# Patient Record
Sex: Female | Born: 1945
Health system: Southern US, Community
[De-identification: ages and names within clinical notes are randomized; demographics above are authoritative.]

## PROBLEM LIST (undated history)

## (undated) DIAGNOSIS — I4510 Unspecified right bundle-branch block: Secondary | ICD-10-CM

## (undated) DIAGNOSIS — I10 Essential (primary) hypertension: Secondary | ICD-10-CM

## (undated) DIAGNOSIS — E785 Hyperlipidemia, unspecified: Secondary | ICD-10-CM

## (undated) DIAGNOSIS — M1990 Unspecified osteoarthritis, unspecified site: Secondary | ICD-10-CM

## (undated) DIAGNOSIS — I639 Cerebral infarction, unspecified: Secondary | ICD-10-CM

## (undated) DIAGNOSIS — E669 Obesity, unspecified: Secondary | ICD-10-CM

## (undated) DIAGNOSIS — I350 Nonrheumatic aortic (valve) stenosis: Secondary | ICD-10-CM

## (undated) HISTORY — PX: BREAST BIOPSY: SHX20

## (undated) HISTORY — DX: Cerebral infarction, unspecified: I63.9

---

## 2014-01-07 DIAGNOSIS — H2511 Age-related nuclear cataract, right eye: Secondary | ICD-10-CM | POA: Insufficient documentation

## 2014-01-07 DIAGNOSIS — H18519 Endothelial corneal dystrophy, unspecified eye: Secondary | ICD-10-CM | POA: Insufficient documentation

## 2014-01-07 DIAGNOSIS — H35372 Puckering of macula, left eye: Secondary | ICD-10-CM | POA: Insufficient documentation

## 2014-01-15 DIAGNOSIS — H35352 Cystoid macular degeneration, left eye: Secondary | ICD-10-CM | POA: Insufficient documentation

## 2015-01-18 DIAGNOSIS — Z961 Presence of intraocular lens: Secondary | ICD-10-CM | POA: Insufficient documentation

## 2016-06-26 ENCOUNTER — Encounter (HOSPITAL_COMMUNITY): Payer: Self-pay | Admitting: Internal Medicine

## 2016-06-26 ENCOUNTER — Other Ambulatory Visit (HOSPITAL_COMMUNITY): Payer: Self-pay

## 2016-06-26 ENCOUNTER — Inpatient Hospital Stay (HOSPITAL_COMMUNITY)
Admission: EM | Admit: 2016-06-26 | Discharge: 2016-06-28 | DRG: 065 | Disposition: A | Discharge status: Home Health | Payer: Medicare Other | Attending: Internal Medicine | Admitting: Internal Medicine

## 2016-06-26 ENCOUNTER — Observation Stay (HOSPITAL_BASED_OUTPATIENT_CLINIC_OR_DEPARTMENT_OTHER): Payer: Medicare Other

## 2016-06-26 ENCOUNTER — Emergency Department (HOSPITAL_COMMUNITY): Payer: Medicare Other

## 2016-06-26 ENCOUNTER — Observation Stay (HOSPITAL_COMMUNITY): Payer: Medicare Other

## 2016-06-26 DIAGNOSIS — I63511 Cerebral infarction due to unspecified occlusion or stenosis of right middle cerebral artery: Secondary | ICD-10-CM | POA: Diagnosis not present

## 2016-06-26 DIAGNOSIS — E785 Hyperlipidemia, unspecified: Secondary | ICD-10-CM | POA: Diagnosis present

## 2016-06-26 DIAGNOSIS — I639 Cerebral infarction, unspecified: Secondary | ICD-10-CM | POA: Diagnosis present

## 2016-06-26 DIAGNOSIS — R29810 Facial weakness: Secondary | ICD-10-CM | POA: Diagnosis not present

## 2016-06-26 DIAGNOSIS — I6789 Other cerebrovascular disease: Secondary | ICD-10-CM

## 2016-06-26 DIAGNOSIS — I6523 Occlusion and stenosis of bilateral carotid arteries: Secondary | ICD-10-CM | POA: Diagnosis present

## 2016-06-26 DIAGNOSIS — N179 Acute kidney failure, unspecified: Secondary | ICD-10-CM | POA: Diagnosis present

## 2016-06-26 DIAGNOSIS — E669 Obesity, unspecified: Secondary | ICD-10-CM | POA: Diagnosis present

## 2016-06-26 DIAGNOSIS — I10 Essential (primary) hypertension: Secondary | ICD-10-CM | POA: Diagnosis present

## 2016-06-26 DIAGNOSIS — Z6829 Body mass index (BMI) 29.0-29.9, adult: Secondary | ICD-10-CM

## 2016-06-26 DIAGNOSIS — E876 Hypokalemia: Secondary | ICD-10-CM | POA: Diagnosis present

## 2016-06-26 DIAGNOSIS — R471 Dysarthria and anarthria: Secondary | ICD-10-CM | POA: Diagnosis present

## 2016-06-26 DIAGNOSIS — R7303 Prediabetes: Secondary | ICD-10-CM | POA: Diagnosis present

## 2016-06-26 DIAGNOSIS — I160 Hypertensive urgency: Secondary | ICD-10-CM | POA: Diagnosis present

## 2016-06-26 DIAGNOSIS — I672 Cerebral atherosclerosis: Secondary | ICD-10-CM | POA: Diagnosis present

## 2016-06-26 DIAGNOSIS — I63331 Cerebral infarction due to thrombosis of right posterior cerebral artery: Secondary | ICD-10-CM | POA: Diagnosis not present

## 2016-06-26 DIAGNOSIS — Z9114 Patient's other noncompliance with medication regimen: Secondary | ICD-10-CM

## 2016-06-26 DIAGNOSIS — R9431 Abnormal electrocardiogram [ECG] [EKG]: Secondary | ICD-10-CM | POA: Diagnosis present

## 2016-06-26 DIAGNOSIS — G459 Transient cerebral ischemic attack, unspecified: Secondary | ICD-10-CM | POA: Diagnosis not present

## 2016-06-26 DIAGNOSIS — R2990 Unspecified symptoms and signs involving the nervous system: Secondary | ICD-10-CM | POA: Diagnosis present

## 2016-06-26 DIAGNOSIS — I161 Hypertensive emergency: Secondary | ICD-10-CM | POA: Diagnosis present

## 2016-06-26 DIAGNOSIS — I4510 Unspecified right bundle-branch block: Secondary | ICD-10-CM | POA: Diagnosis present

## 2016-06-26 DIAGNOSIS — E7439 Other disorders of intestinal carbohydrate absorption: Secondary | ICD-10-CM | POA: Diagnosis present

## 2016-06-26 HISTORY — DX: Essential (primary) hypertension: I10

## 2016-06-26 HISTORY — DX: Obesity, unspecified: E66.9

## 2016-06-26 LAB — ECHOCARDIOGRAM COMPLETE
AOPV: 0.35 m/s
AV Area mean vel: 1.31 cm2
AV Peak grad: 29 mmHg
AV VEL mean LVOT/AV: 0.38
AV area mean vel ind: 0.65 cm2/m2
AV pk vel: 268 cm/s
AVA: 1.66 cm2
AVAREAVTI: 1.21 cm2
AVAREAVTIIND: 0.82 cm2/m2
AVG: 13 mmHg
CHL CUP AV PEAK INDEX: 0.6
CHL CUP AV VALUE AREA INDEX: 0.82
CHL CUP AV VEL: 1.66
CHL CUP DOP CALC LVOT VTI: 28.1 cm
DOP CAL AO MEAN VELOCITY: 162 cm/s
E decel time: 289 msec
E/e' ratio: 9.75
FS: 47 % — AB (ref 28–44)
IV/PV OW: 0.98
LA ID, A-P, ES: 31 mm
LA diam end sys: 31 mm
LA diam index: 1.54 cm/m2
LA vol A4C: 40.7 ml
LAVOL: 48.9 mL
LAVOLIN: 24.3 mL/m2
LDCA: 3.46 cm2
LV PW d: 10.2 mm — AB (ref 0.6–1.1)
LVEEAVG: 9.75
LVEEMED: 9.75
LVELAT: 6.42 cm/s
LVOTD: 21 mm
LVOTPV: 93.8 cm/s
LVOTSV: 97 mL
LVOTVTI: 0.48 cm
MV Dec: 289
MV pk A vel: 94.6 m/s
MVPKEVEL: 62.6 m/s
RV TAPSE: 19.5 mm
TDI e' lateral: 6.42
TDI e' medial: 4.46
VTI: 58.7 cm

## 2016-06-26 LAB — CBC
HEMATOCRIT: 43.2 % (ref 36.0–46.0)
Hemoglobin: 13.7 g/dL (ref 12.0–15.0)
MCH: 25.1 pg — AB (ref 26.0–34.0)
MCHC: 31.7 g/dL (ref 30.0–36.0)
MCV: 79.1 fL (ref 78.0–100.0)
PLATELETS: 187 10*3/uL (ref 150–400)
RBC: 5.46 MIL/uL — ABNORMAL HIGH (ref 3.87–5.11)
RDW: 15.9 % — AB (ref 11.5–15.5)
WBC: 5.4 10*3/uL (ref 4.0–10.5)

## 2016-06-26 LAB — LIPID PANEL
Cholesterol: 255 mg/dL — ABNORMAL HIGH (ref 0–200)
HDL: 56 mg/dL (ref >40)
LDL CALC: 189 mg/dL — AB (ref 0–99)
Total CHOL/HDL Ratio: 4.6 RATIO
Triglycerides: 49 mg/dL (ref <150)
VLDL: 10 mg/dL (ref 0–40)

## 2016-06-26 LAB — GLUCOSE, CAPILLARY: GLUCOSE-CAPILLARY: 110 mg/dL — AB (ref 65–99)

## 2016-06-26 LAB — DIFFERENTIAL
BASOS PCT: 1 %
Basophils Absolute: 0.1 10*3/uL (ref 0.0–0.1)
EOS PCT: 3 %
Eosinophils Absolute: 0.1 10*3/uL (ref 0.0–0.7)
Lymphocytes Relative: 38 %
Lymphs Abs: 2.1 10*3/uL (ref 0.7–4.0)
MONO ABS: 0.3 10*3/uL (ref 0.1–1.0)
MONOS PCT: 6 %
NEUTROS ABS: 2.8 10*3/uL (ref 1.7–7.7)
Neutrophils Relative %: 53 %

## 2016-06-26 LAB — COMPREHENSIVE METABOLIC PANEL
ALK PHOS: 68 U/L (ref 38–126)
ALT: 19 U/L (ref 14–54)
AST: 24 U/L (ref 15–41)
Albumin: 3.5 g/dL (ref 3.5–5.0)
Anion gap: 19 — ABNORMAL HIGH (ref 5–15)
BUN: 16 mg/dL (ref 6–20)
CALCIUM: 10.4 mg/dL — AB (ref 8.9–10.3)
CHLORIDE: 100 mmol/L — AB (ref 101–111)
CO2: 27 mmol/L (ref 22–32)
CREATININE: 1.08 mg/dL — AB (ref 0.44–1.00)
GFR calc Af Amer: 58 mL/min — ABNORMAL LOW (ref >60)
GFR, EST NON AFRICAN AMERICAN: 50 mL/min — AB (ref >60)
Glucose, Bld: 112 mg/dL — ABNORMAL HIGH (ref 65–99)
Potassium: 3.2 mmol/L — ABNORMAL LOW (ref 3.5–5.1)
Sodium: 146 mmol/L — ABNORMAL HIGH (ref 135–145)
Total Bilirubin: 0.7 mg/dL (ref 0.3–1.2)
Total Protein: 7.4 g/dL (ref 6.5–8.1)

## 2016-06-26 LAB — I-STAT CHEM 8, ED
BUN: 18 mg/dL (ref 6–20)
CALCIUM ION: 1.14 mmol/L (ref 1.12–1.23)
CHLORIDE: 106 mmol/L (ref 101–111)
Creatinine, Ser: 1 mg/dL (ref 0.44–1.00)
Glucose, Bld: 107 mg/dL — ABNORMAL HIGH (ref 65–99)
HCT: 43 % (ref 36.0–46.0)
HEMOGLOBIN: 14.6 g/dL (ref 12.0–15.0)
Potassium: 3.4 mmol/L — ABNORMAL LOW (ref 3.5–5.1)
SODIUM: 144 mmol/L (ref 135–145)
TCO2: 28 mmol/L (ref 0–100)

## 2016-06-26 LAB — URINALYSIS, ROUTINE W REFLEX MICROSCOPIC
BILIRUBIN URINE: NEGATIVE
Glucose, UA: NEGATIVE mg/dL
HGB URINE DIPSTICK: NEGATIVE
Ketones, ur: NEGATIVE mg/dL
Leukocytes, UA: NEGATIVE
Nitrite: NEGATIVE
PROTEIN: NEGATIVE mg/dL
Specific Gravity, Urine: 1.01 (ref 1.005–1.030)
pH: 7.5 (ref 5.0–8.0)

## 2016-06-26 LAB — RAPID URINE DRUG SCREEN, HOSP PERFORMED
AMPHETAMINES: NOT DETECTED
BARBITURATES: NOT DETECTED
BENZODIAZEPINES: NOT DETECTED
Cocaine: NOT DETECTED
Opiates: NOT DETECTED
Tetrahydrocannabinol: NOT DETECTED

## 2016-06-26 LAB — APTT: aPTT: 28 seconds (ref 24–37)

## 2016-06-26 LAB — ETHANOL

## 2016-06-26 LAB — CBG MONITORING, ED: GLUCOSE-CAPILLARY: 95 mg/dL (ref 65–99)

## 2016-06-26 LAB — I-STAT TROPONIN, ED: TROPONIN I, POC: 0.01 ng/mL (ref 0.00–0.08)

## 2016-06-26 LAB — PROTIME-INR
INR: 0.99 (ref 0.00–1.49)
PROTHROMBIN TIME: 13.3 s (ref 11.6–15.2)

## 2016-06-26 MED ORDER — HYDRALAZINE HCL 20 MG/ML IJ SOLN
10.0000 mg | INTRAMUSCULAR | Status: DC | PRN
  Administered 2016-06-27 – 2016-06-28 (×2): 10 mg via INTRAVENOUS
  Filled 2016-06-26 (×2): qty 1

## 2016-06-26 MED ORDER — ENOXAPARIN SODIUM 40 MG/0.4ML ~~LOC~~ SOLN
40.0000 mg | SUBCUTANEOUS | Status: DC
  Administered 2016-06-26 – 2016-06-27 (×2): 40 mg via SUBCUTANEOUS
  Filled 2016-06-26 (×3): qty 0.4

## 2016-06-26 MED ORDER — ACETAMINOPHEN 325 MG PO TABS: 650.0000 mg | ORAL_TABLET | ORAL | Status: DC | PRN

## 2016-06-26 MED ORDER — POTASSIUM CHLORIDE CRYS ER 20 MEQ PO TBCR
40.0000 meq | EXTENDED_RELEASE_TABLET | Freq: Once | ORAL | Status: AC
  Administered 2016-06-26: 40 meq via ORAL
  Filled 2016-06-26: qty 2

## 2016-06-26 MED ORDER — SODIUM CHLORIDE 0.9 % IV SOLN
INTRAVENOUS | Status: DC
  Administered 2016-06-26: 12:00:00 via INTRAVENOUS

## 2016-06-26 MED ORDER — LABETALOL HCL 5 MG/ML IV SOLN
20.0000 mg | Freq: Once | INTRAVENOUS | Status: AC
  Administered 2016-06-26: 20 mg via INTRAVENOUS

## 2016-06-26 MED ORDER — LISINOPRIL 5 MG PO TABS
5.0000 mg | ORAL_TABLET | Freq: Every day | ORAL | Status: DC
  Administered 2016-06-26: 5 mg via ORAL
  Filled 2016-06-26: qty 1

## 2016-06-26 MED ORDER — HYDROCHLOROTHIAZIDE 12.5 MG PO CAPS
12.5000 mg | ORAL_CAPSULE | Freq: Every day | ORAL | Status: DC
  Administered 2016-06-27 – 2016-06-28 (×2): 12.5 mg via ORAL
  Filled 2016-06-26 (×2): qty 1

## 2016-06-26 MED ORDER — ATORVASTATIN CALCIUM 80 MG PO TABS
80.0000 mg | ORAL_TABLET | Freq: Every day | ORAL | Status: DC
  Administered 2016-06-26 – 2016-06-27 (×2): 80 mg via ORAL
  Filled 2016-06-26 (×2): qty 1

## 2016-06-26 MED ORDER — CLOPIDOGREL BISULFATE 75 MG PO TABS
75.0000 mg | ORAL_TABLET | Freq: Every day | ORAL | Status: DC
  Administered 2016-06-26 – 2016-06-28 (×3): 75 mg via ORAL
  Filled 2016-06-26 (×3): qty 1

## 2016-06-26 MED ORDER — STROKE: EARLY STAGES OF RECOVERY BOOK
Freq: Once | Status: AC
  Administered 2016-06-26: 07:00:00
  Filled 2016-06-26: qty 1

## 2016-06-26 MED ORDER — LISINOPRIL 10 MG PO TABS: 10.0000 mg | ORAL_TABLET | Freq: Every day | ORAL | Status: DC

## 2016-06-26 MED ORDER — ACETAMINOPHEN 650 MG RE SUPP: 650.0000 mg | RECTAL | Status: DC | PRN

## 2016-06-26 MED ORDER — LABETALOL HCL 5 MG/ML IV SOLN
INTRAVENOUS | Status: AC
  Filled 2016-06-26: qty 4

## 2016-06-26 MED ORDER — ASPIRIN 325 MG PO TABS
325.0000 mg | ORAL_TABLET | Freq: Every day | ORAL | Status: DC
  Administered 2016-06-26 – 2016-06-28 (×3): 325 mg via ORAL
  Filled 2016-06-26 (×3): qty 1

## 2016-06-26 MED ORDER — HYDRALAZINE HCL 20 MG/ML IJ SOLN: 5.0000 mg | INTRAMUSCULAR | Status: DC | PRN

## 2016-06-26 MED ORDER — SENNOSIDES-DOCUSATE SODIUM 8.6-50 MG PO TABS
1.0000 | ORAL_TABLET | Freq: Every evening | ORAL | Status: DC | PRN
  Filled 2016-06-26: qty 1

## 2016-06-26 MED ORDER — LISINOPRIL 5 MG PO TABS
5.0000 mg | ORAL_TABLET | Freq: Two times a day (BID) | ORAL | Status: DC
  Administered 2016-06-26 – 2016-06-28 (×4): 5 mg via ORAL
  Filled 2016-06-26 (×4): qty 1

## 2016-06-26 NOTE — Progress Notes (Signed)
  Echocardiogram 2D Echocardiogram has been performed.  Tye SavoyCasey N Jovontae Banko 06/26/2016, 2:55 PM

## 2016-06-26 NOTE — ED Provider Notes (Signed)
CSN: [id number]     Arrival date & time 06/26/16  0508 History   First MD Initiated Contact with Patient 06/26/16 0518     Chief Complaint  Patient presents with  . Numbness    An emergency department physician performed an initial assessment on this suspected stroke patient at [phone]. (Consider location/radiation/quality/duration/timing/severity/associated sxs/prior Treatment) HPI  [name] is a 70 year old female with a past medical history of hypertension presenting today with strokelike symptoms. Patient states she has left-sided numbness. She denies history of stroke. Per EMS, patient initially had slurred speech and facial droop which resolved on its own upon ED arrival. Patient states this happened to her yesterday as well and resolved on its own. Today was much more permanent. Nothing makes the symptoms better or worse. There are no further complaints.  10 Systems reviewed and are negative for acute change except as noted in the HPI.     No past medical history on file. No past surgical history on file. No family history on file. Social History  Substance Use Topics  . Smoking status: Not on file  . Smokeless tobacco: Not on file  . Alcohol Use: Not on file   OB History    No data available     Review of Systems    Allergies  Review of patient's allergies indicates not on file.  Home Medications   Prior to Admission medications   Not on File   BP 232/109 mmHg  Pulse 70  Temp(Src) 98.1 F (36.7 C) (Oral)  Resp 21  SpO2 100% Physical Exam  Constitutional: She is oriented to person, place, and time. She appears well-developed and well-nourished. No distress.  HENT:  Head: Normocephalic and atraumatic.  Nose: Nose normal.  Mouth/Throat: Oropharynx is clear and moist. No oropharyngeal exudate.  Eyes: Conjunctivae and EOM are normal. Pupils are equal, round, and reactive to light. No scleral icterus.  Neck: Normal range of motion. Neck supple. No JVD present.  No tracheal deviation present. No thyromegaly present.  Cardiovascular: Normal rate, regular rhythm and normal heart sounds.  Exam reveals no gallop and no friction rub.   No murmur heard. Pulmonary/Chest: Effort normal and breath sounds normal. No respiratory distress. She has no wheezes. She exhibits no tenderness.  Abdominal: Soft. Bowel sounds are normal. She exhibits no distension and no mass. There is no tenderness. There is no rebound and no guarding.  Musculoskeletal: Normal range of motion. She exhibits no edema or tenderness.   L sided dec sensation  Lymphadenopathy:    She has no cervical adenopathy.  Neurological: She is alert and oriented to person, place, and time. No cranial nerve deficit. She exhibits normal muscle tone.  Skin: Skin is warm and dry. No rash noted. No erythema. No pallor.  Nursing note and vitals reviewed.   ED Course  Procedures (including critical care time) Labs Review Labs Reviewed  CBC - Abnormal; Notable for the following:    RBC 5.46 (*)    MCH 25.1 (*)    RDW 15.9 (*)    All other components within normal limits  COMPREHENSIVE METABOLIC PANEL - Abnormal; Notable for the following:    Sodium 146 (*)    Potassium 3.2 (*)    Chloride 100 (*)    Glucose, Bld 112 (*)    Creatinine, Ser 1.08 (*)    Calcium 10.4 (*)    GFR calc non Af Amer 50 (*)    GFR calc Af Amer 58 (*)  Anion gap 19 (*)    All other components within normal limits  I-STAT CHEM 8, ED - Abnormal; Notable for the following:    Potassium 3.4 (*)    Glucose, Bld 107 (*)    All other components within normal limits  ETHANOL  PROTIME-INR  APTT  DIFFERENTIAL  URINE RAPID DRUG SCREEN, HOSP PERFORMED  URINALYSIS, ROUTINE W REFLEX MICROSCOPIC (NOT AT [facility])  I-STAT TROPOININ, ED  CBG MONITORING, ED    Imaging Review Ct Head Code Stroke W/o Cm  06/26/2016  CLINICAL DATA:  Code stroke. Acute onset of left-sided numbness. Initial encounter. EXAM: CT HEAD WITHOUT CONTRAST  TECHNIQUE: Contiguous axial images were obtained from the base of the skull through the vertex without intravenous contrast. COMPARISON:  None. FINDINGS: There is no evidence of acute infarction, mass lesion, or intra- or extra-axial hemorrhage on CT. Mild periventricular white matter change may reflect small vessel ischemic microangiopathy. The posterior fossa, including the cerebellum, brainstem and fourth ventricle, is within normal limits. The third and lateral ventricles, and basal ganglia are unremarkable in appearance. The cerebral hemispheres are symmetric in appearance, with normal gray-white differentiation. No mass effect or midline shift is seen. There is no evidence of fracture; visualized osseous structures are unremarkable in appearance. The visualized portions of the orbits are within normal limits. The paranasal sinuses and mastoid air cells are well-aerated. No significant soft tissue abnormalities are seen. IMPRESSION: 1. No acute intracranial pathology seen on CT. 2. Suggestion of mild small vessel ischemic microangiopathy. These results were called by telephone at the time of interpretation on 06/26/2016 at 5:27 am to Dr. Roseanne RenoStewart, who verbally acknowledged these results. Electronically Signed   By: Roanna RaiderJeffery  Chang M.D.   On: 06/26/2016 05:27   I have personally reviewed and evaluated these images and lab results as part of my medical decision-making.   EKG Interpretation   Date/Time:  Monday June 26 2016 05:31:41 EDT Ventricular Rate:  69 PR Interval:    QRS Duration: 156 QT Interval:  422 QTC Calculation: 453 R Axis:   10 Text Interpretation:  Sinus rhythm Right bundle branch block No old  tracing to compare Confirmed by Erroll Lunani, Ariadna Setter Ayokunle [id number]) on 06/26/2016  5:38:41 AM      MDM   Final diagnoses:  None   Patient presents to the ED for numbness on the L side.  She states she is still having the symptoms but they have improved.  Her BP is very high and perhaps the cause  of her stroke.  CT head neg for bleed.  Dr. Roseanne RenoStewart evaluated the patient and recs for medical admission for further work up.  He ordered labetalol for the HTN.  Will page hospitalist for further care.   6:29 AM Is poke with Dr. Toniann FailKakrakandy who accrpts the patient for further care.   Tomasita CrumbleAdeleke Tushar Enns, MD 06/26/16 [id number]

## 2016-06-26 NOTE — Care Management Note (Signed)
Case Management Note  Patient Details  Name: Allison Schneider MRN: [id number] Date of Birth: 11/18/1946  Subjective/Objective:                  70 y.o. female with medical history significant obesity, hypertension presents to the emergency department from home with the chief complaint of left sided numbness/tingling and left leg weakness. Initial evaluation concerning for CVA.// From home; resides in [place].  Action/Plan: Follow for disposition needs.   Expected Discharge Date:                  Expected Discharge Plan:  Home w Home Health Services  In-House Referral:     Discharge planning Services  CM Consult  Post Acute Care Choice:    Choice offered to:     DME Arranged:    DME Agency:     HH Arranged:    HH Agency:     Status of Service:  In process, will continue to follow  If discussed at Long Length of Stay Meetings, dates discussed:    Additional Comments:  Oletta CohnWood, Demetrius Mahler, RN 06/26/2016, 10:11 AM

## 2016-06-26 NOTE — ED Notes (Signed)
Attempted to call report x 1  

## 2016-06-26 NOTE — H&P (Signed)
History and Physical    Allison Schneider RUE:[id number] DOB: 09/09/1946 DOA: 06/26/2016  PCP: No primary care provider on file. PCP in [place] Patient coming from: home  Chief Complaint: left sided numbness/tingling/left leg weakness  HPI: Allison Schneider is a very pleasant 70 y.o. female with medical history significant obesity, hypertension presents to the emergency department from home with the chief complaint of left sided numbness/tingling and left leg weakness. Initial evaluation concerning for CVA.  Information is obtained from the patient. She reports a 20 minute episode of left arm numbness tingling left leg numbness tingling and weakness and left facial "tightness" 2 days ago. This morning at 3 AM she was awakened to go to the restroom and noted the same symptoms "only worse". In addition she noted her gait was unsteady as she was walking to the bathroom. She denied headache dizziness visual disturbances but described her gait as "wobbly". She went to the kitchen and drank some water she denied any difficulty swallowing. She noted the symptoms were "worse and not going away" and she decided call 911. She reports EMS commented that she had a left facial droop and that her speech was slurred. She denies chest pain palpitation abdominal pain nausea vomiting. She denies any fever chills dysuria hematuria frequency or urgency. She denies constipation diarrhea melena or bright red blood per rectum. She has recently traveled to the area from the [place] where she resides. She does not smoke and has never smoked. She denies any illicit drug use. No EtOH    ED Course: He received IV fluids and labetalol intravenously for hypertension  Review of Systems: As per HPI otherwise 10 point review of systems negative.   Ambulatory Status: Prior to this she ambulates independently with a steady gait  Past Medical History  Diagnosis Date  . Obesity   . Hypertension     No past surgical history  on file.  Social History   Social History  . Marital Status: N/A    Spouse Name: N/A  . Number of Children: N/A  . Years of Education: N/A   Occupational History  . Not on file.   Social History Main Topics  . Smoking status: Not on file  . Smokeless tobacco: Not on file  . Alcohol Use: Not on file  . Drug Use: Not on file  . Sexual Activity: Not on file   Other Topics Concern  . Not on file   Social History Narrative  . No narrative on file    No Known Allergies  Family History  Problem Relation Age of Onset  . CAD Mother     MI   patient is adopted and notes her biological mother with CAD. Other family medical hx unknown  Prior to Admission medications   Not on File    Physical Exam: Filed Vitals:   06/26/16 0630 06/26/16 0645 06/26/16 0706 06/26/16 0715  BP: 203/95 200/110 202/98 201/97  Pulse: 64 64 66 64  Temp:      TempSrc:      Resp: 12 18 21 18   SpO2: 100% 100% 99% 100%     General:  Appears calm and comfortable Eyes:  PERRL, EOMI, normal lids, iris ENT:  grossly normal hearing, lips & tongue, Mucous membranes of her mouth are pink slightly dry Neck:  no LAD, masses or thyromegaly Cardiovascular:  RRR, no m/r/g. No LE edema. Pulses present and palpable Respiratory:  CTA bilaterally, no w/r/r. Normal respiratory effort. Abdomen:  soft, ntnd,  positive bowel sounds throughout no guarding or rebounding Skin:  no rash or induration seen on limited exam Musculoskeletal:  grossly normal tone BUE/BLE, good ROM, no bony abnormality Psychiatric:  grossly normal mood and affect, speech fluent and appropriate, AOx3 Neurologic:  CN 2-12 grossly intact, moves all extremities in coordinated fashion, sensation intact. Bilateral grip 5 out of 5 lower extremity strength 5 out of 5 reports decreased sensation on left lateral leg tongue midline no pronator drift speech clear facial symmetry  Labs on Admission: I have personally reviewed following labs and imaging  studies  CBC:  Recent Labs Lab 06/26/16 0515 06/26/16 0518  WBC 5.4  --   NEUTROABS 2.8  --   HGB 13.7 14.6  HCT 43.2 43.0  MCV 79.1  --   PLT 187  --    Basic Metabolic Panel:  Recent Labs Lab 06/26/16 0515 06/26/16 0518  NA 146* 144  K 3.2* 3.4*  CL 100* 106  CO2 27  --   GLUCOSE 112* 107*  BUN 16 18  CREATININE 1.08* 1.00  CALCIUM 10.4*  --    GFR: CrCl cannot be calculated (Unknown ideal weight.). Liver Function Tests:  Recent Labs Lab 06/26/16 0515  AST 24  ALT 19  ALKPHOS 68  BILITOT 0.7  PROT 7.4  ALBUMIN 3.5   No results for input(s): LIPASE, AMYLASE in the last 168 hours. No results for input(s): AMMONIA in the last 168 hours. Coagulation Profile:  Recent Labs Lab 06/26/16 0515  INR 0.99   Cardiac Enzymes: No results for input(s): CKTOTAL, CKMB, CKMBINDEX, TROPONINI in the last 168 hours. BNP (last 3 results) No results for input(s): PROBNP in the last 8760 hours. HbA1C: No results for input(s): HGBA1C in the last 72 hours. CBG:  Recent Labs Lab 06/26/16 0535  GLUCAP 95   Lipid Profile: No results for input(s): CHOL, HDL, LDLCALC, TRIG, CHOLHDL, LDLDIRECT in the last 72 hours. Thyroid Function Tests: No results for input(s): TSH, T4TOTAL, FREET4, T3FREE, THYROIDAB in the last 72 hours. Anemia Panel: No results for input(s): VITAMINB12, FOLATE, FERRITIN, TIBC, IRON, RETICCTPCT in the last 72 hours. Urine analysis:    Component Value Date/Time   COLORURINE YELLOW 06/26/2016 0700   APPEARANCEUR CLEAR 06/26/2016 0700   LABSPEC 1.010 06/26/2016 0700   PHURINE 7.5 06/26/2016 0700   GLUCOSEU NEGATIVE 06/26/2016 0700   HGBUR NEGATIVE 06/26/2016 0700   BILIRUBINUR NEGATIVE 06/26/2016 0700   KETONESUR NEGATIVE 06/26/2016 0700   PROTEINUR NEGATIVE 06/26/2016 0700   NITRITE NEGATIVE 06/26/2016 0700   LEUKOCYTESUR NEGATIVE 06/26/2016 0700    Creatinine Clearance: CrCl cannot be calculated (Unknown ideal weight.).  Sepsis  Labs: (procalcitonin:4,lacticidven:4) )No results found for this or any previous visit (from the past 240 hour(s)).   Radiological Exams on Admission: Dg Chest 2 View  06/26/2016  CLINICAL DATA:  Sudden onset of left-sided weakness and numbness earlier today common no chest complaints EXAM: CHEST  2 VIEW COMPARISON:  None in PACs FINDINGS: The lungs are well-expanded and clear. The heart and pulmonary vascularity are normal. There is calcification in the wall of the aortic arch. There is no pleural effusion or pneumothorax. The bony thorax is unremarkable. IMPRESSION: 1. No active cardiopulmonary disease. 2. Aortic atherosclerosis. Electronically Signed   By: David  [place] M.D.   On: 06/26/2016 07:39   Ct Head Code Stroke W/o Cm  06/26/2016  CLINICAL DATA:  Code stroke. Acute onset of left-sided numbness. Initial encounter. EXAM: CT HEAD WITHOUT CONTRAST TECHNIQUE: Contiguous axial images  were obtained from the base of the skull through the vertex without intravenous contrast. COMPARISON:  None. FINDINGS: There is no evidence of acute infarction, mass lesion, or intra- or extra-axial hemorrhage on CT. Mild periventricular white matter change may reflect small vessel ischemic microangiopathy. The posterior fossa, including the cerebellum, brainstem and fourth ventricle, is within normal limits. The third and lateral ventricles, and basal ganglia are unremarkable in appearance. The cerebral hemispheres are symmetric in appearance, with normal gray-white differentiation. No mass effect or midline shift is seen. There is no evidence of fracture; visualized osseous structures are unremarkable in appearance. The visualized portions of the orbits are within normal limits. The paranasal sinuses and mastoid air cells are well-aerated. No significant soft tissue abnormalities are seen. IMPRESSION: 1. No acute intracranial pathology seen on CT. 2. Suggestion of mild small vessel ischemic microangiopathy. These  results were called by telephone at the time of interpretation on 06/26/2016 at 5:27 am to Dr. Roseanne RenoStewart, who verbally acknowledged these results. Electronically Signed   By: Roanna RaiderJeffery  Chang M.D.   On: 06/26/2016 05:27    EKG: Independently reviewed. Sinus rhythm Right bundle branch block   Assessment/Plan Principal Problem:   Stroke-like symptoms Active Problems:   Stroke (HCC)   Hypokalemia   Hypertension   Acute kidney injury (HCC)   Obesity   Hypercalcemia   Abnormal EKG   #1. Strokelike symptoms. Patient with 2 episodes left facial tightness, left arm numbness tingling, left leg numbness tingling and weakness. Symptoms mostly resolved on admission. Risk factors include obesity and possible family medical history. Neurology consulted while in the ED who opined patient not a candidate for TPA due to minimal neurological changes. -Admit to telemetry -Obtain chest x-ray 2-D echo carotid Doppler -Obtain MRI/MRA of the brain -Obtain lipid panel hemoglobin A1c -Aspirin -Statin -Control blood pressure -Physical therapy, occupational therapy -Healthy diet once he passes a bedside swallow eval  #2. Hypertensive urgency. Blood pressure 232/109 on admission. She received 20 mg of labetalol IV in the emergency department. Blood pressure 201/97 on admission. Patient reports being diagnosed with hypertension in the past but not on any medications. -When necessary hydralazine -Monitor parameters closely -Consider adding low-dose ACE inhibitor if blood pressure remains elevated  #3. Hypokalemia. Mild. Potassium 3.4 on admission. -Replete -Recheck in the morning  #4. Acute kidney injury. Mild. Creatinine 1.08 on admission. Likely related to decreased oral intake -Monitor urine output -Hold nephrotoxins -Gentle IV fluids -Recheck in the morning  #5. Obesity. -obtain weight -consider nutritional consult  #6. Hypercalcemia. mild Calcium 10.4.  -gentle fluids -recheck in am  #7. Abnormal  EKG. EKG with RBBB. No chest pain. Initial troponin negative -monitor on tele -echo as noted above       DVT prophylaxis: scd  Code Status: full  Family Communication: none present. [place]  Disposition Plan: home  Consults called: neuro consulted by ED MD  Admission status: obs   Gwenyth BenderBLACK,KAREN M MD [facility]  If 7PM-7AM, please contact night-coverage [email or web address] Password [email or web address]  06/26/2016, 8:43 AM

## 2016-06-26 NOTE — Code Documentation (Signed)
Code stroke called at 0502 for this 70 y/o black female pt who was LSW at 0330 when she awoke to go to the bathroom.  On return from the bathroom she states  Her left arm, hand and leg felt numb and she had difficulty returning to her bed.  She called EMS and upon their arrival they noted her to have slurred speech and a left facial  droop with a BP 209/113.  Pt admits to not taking her BP meds for the past year.CBG 116  [facility]508 pt's slurred speech and facial droop had resolved.  She was cleared for CT by Dr Mora Bellmanni at [phone] arriving in CT at [phone].  The result of her head CT of no acute intracranial pathology was called to Dr Roseanne RenoStewart at [phone].  Pt was returned to ED room A4 where she scored a 2 on the NIHSS with 1 point  given for decreased sensation on left side and 1 point for left LE ataxia. Her BP on return from CT was 232/109 for which she was given Labetolol 20 mg IV per Dr Roseanne RenoStewart.  Pt remains within the TPA window until 0800 hrs.

## 2016-06-26 NOTE — Evaluation (Signed)
Physical Therapy Evaluation and Discharge Patient Details Name: Allison Schneider MRN: [id number] DOB: 12/08/1946 Today's Date: 06/26/2016   History of Present Illness  70 y.o. female with a history of hypertension currently not on medication, presenting with new onset left-sided numbness and slurred speech. Transient left facial droop was noted by EMS. Patient has no previous history of stroke nor TIA. She has not been on antiplatelet therapy. CT scan of her head showed no acute intracranial abnormality. She did not experience weakness involving left extremities. Speech has returned to normal. NIH stroke score was 2. Blood pressure was elevated at 232/109. She was given labetalol 20 mg IV. She was LKW at 3:30 AM on 06/26/2016. Patient was not administered IV t-PA secondary to minimal deficits. MRI + subtle acute to subacute lacunar infarct in the  Clinical Impression  Pt admitted with above. Pt functioning at baseline and all symptoms have resolved. Pt at minimal falls risk as indicated by score of 23/24 on DGI. Pt with good home set up and support. Pt with no further acute PT needs at this time. PT SIGNING OFF. Please re-consult if needed in future.    Follow Up Recommendations No PT follow up    Equipment Recommendations  None recommended by PT    Recommendations for Other Services       Precautions / Restrictions Precautions Precautions: None Restrictions Weight Bearing Restrictions: No      Mobility  Bed Mobility Overal bed mobility: Independent                Transfers Overall transfer level: Modified independent Equipment used: None             General transfer comment: no episodes of instability  Ambulation/Gait Ambulation/Gait assistance: Independent Ambulation Distance (Feet): 300 Feet Assistive device: None Gait Pattern/deviations: Step-to pattern Gait velocity: wfl Gait velocity interpretation: at or above normal speed for age/gender General Gait Details: no  episodes of LOB  Stairs Stairs: Yes Stairs assistance: Modified independent (Device/Increase time) Stair Management: One rail Left;Alternating pattern Number of Stairs: 10 General stair comments: safe  Wheelchair Mobility    Modified Rankin (Stroke Patients Only) Modified Rankin (Stroke Patients Only) Pre-Morbid Rankin Score: No symptoms Modified Rankin: No significant disability     Balance                                 Standardized Balance Assessment Standardized Balance Assessment : Dynamic Gait Index   Dynamic Gait Index Level Surface: Normal Change in Gait Speed: Normal Gait with Horizontal Head Turns: Normal Gait with Vertical Head Turns: Mild Impairment Gait and Pivot Turn: Normal Step Over Obstacle: Normal Step Around Obstacles: Normal Steps: Normal Total Score: 23       Pertinent Vitals/Pain Pain Assessment: No/denies pain    Home Living Family/patient expects to be discharged to:: Private residence Living Arrangements: Other (Comment) Available Help at Discharge: Available PRN/intermittently Type of Home: House Home Access: Ramped entrance     Home Layout: One level   Additional Comments: currently visiting sister who is an amputee adn home is accessible    Prior Function Level of Independence: Independent               Hand Dominance   Dominant Hand: Right    Extremity/Trunk Assessment   Upper Extremity Assessment: Overall WFL for tasks assessed           Lower Extremity Assessment: [facility]  for tasks assessed      Cervical / Trunk Assessment: Normal  Communication   Communication: No difficulties  Cognition Arousal/Alertness: Awake/alert Behavior During Therapy: WFL for tasks assessed/performed Overall Cognitive Status: Within Functional Limits for tasks assessed                      General Comments      Exercises        Assessment/Plan    PT Assessment Patent does not need any  further PT services  PT Diagnosis Generalized weakness   PT Problem List    PT Treatment Interventions     PT Goals (Current goals can be found in the Care Plan section) Acute Rehab PT Goals Patient Stated Goal: to go home PT Goal Formulation: All assessment and education complete, DC therapy    Frequency     Barriers to discharge        Co-evaluation               End of Session Equipment Utilized During Treatment: Gait belt Activity Tolerance: Patient tolerated treatment well Patient left: in bed;with call bell/phone within reach Nurse Communication: Mobility status    Functional Assessment Tool Used: clinical judgement Functional Limitation: Mobility: Walking and moving around Mobility: Walking and Moving Around Current Status (W1191(G8978): 0 percent impaired, limited or restricted Mobility: Walking and Moving Around Goal Status [phone](G8979): 0 percent impaired, limited or restricted Mobility: Walking and Moving Around Discharge Status [phone](G8980): 0 percent impaired, limited or restricted    Time: [id number] PT Time Calculation (min) (ACUTE ONLY): 30 min   Charges:   PT Evaluation $PT Eval Low Complexity: 1 Procedure PT Treatments $Gait Training: 8-22 mins   PT G Codes:   PT G-Codes **NOT FOR INPATIENT CLASS** Functional Assessment Tool Used: clinical judgement Functional Limitation: Mobility: Walking and moving around Mobility: Walking and Moving Around Current Status (N6295(G8978): 0 percent impaired, limited or restricted Mobility: Walking and Moving Around Goal Status (M8413(G8979): 0 percent impaired, limited or restricted Mobility: Walking and Moving Around Discharge Status [phone](G8980): 0 percent impaired, limited or restricted    Marcene BrawnChadwell, Allison Schneider 06/26/2016, 4:10 PM   Lewis ShockAshly Arnett Schneider, PT, DPT Pager #: [phone] Office #: [phone]

## 2016-06-26 NOTE — ED Notes (Signed)
Patient transported to MRI 

## 2016-06-26 NOTE — Progress Notes (Signed)
Occupational Therapy Evaluation Patient Details Name: Allison Schneider MRN: [id number] DOB: 01/23/1946 Today's Date: 06/26/2016    History of Present Illness 70 y.o. female with a history of hypertension currently not on medication, presenting with new onset left-sided numbness and slurred speech. Transient left facial droop was noted by EMS. Patient has no previous history of stroke nor TIA. She has not been on antiplatelet therapy. CT scan of her head showed no acute intracranial abnormality. She did not experience weakness involving left extremities. Speech has returned to normal. NIH stroke score was 2. Blood pressure was elevated at 232/109. She was given labetalol 20 mg IV. She was LKW at 3:30 AM on 06/26/2016. Patient was not administered IV t-PA secondary to minimal deficits. MRI + subtle acute to subacute lacunar infarct in the   Clinical Impression   Pt appears back to her baseline regarding ADL and functional mobility for ADL. Pt states her numbness has resolved. [place] sister after going to her high school reunion. [place]. Educated pt on signs and symptoms of CVA using "FAST". Pt safe to D/C home when medically stable. OT signing off.     Follow Up Recommendations  No OT follow up;Supervision - Intermittent    Equipment Recommendations  None recommended by OT    Recommendations for Other Services       Precautions / Restrictions Precautions Precautions: None      Mobility Bed Mobility Overal bed mobility: Independent                Transfers Overall transfer level: Modified independent                    Balance Overall balance assessment: No apparent balance deficits (not formally assessed)                                          ADL Overall ADL's : At baseline                                             Vision Vision Assessment?: Yes Eye  Alignment: Within Functional Limits Ocular Range of Motion: Within Functional Limits Alignment/Gaze Preference: Within Defined Limits Tracking/Visual Pursuits: Able to track stimulus in all quads without difficulty Saccades: Within functional limits Convergence: Within functional limits Visual Fields: No apparent deficits   Perception     Praxis Praxis Praxis tested?: Within functional limits    Pertinent Vitals/Pain Pain Assessment: No/denies pain     Hand Dominance Right   Extremity/Trunk Assessment Upper Extremity Assessment Upper Extremity Assessment: Overall WFL for tasks assessed   Lower Extremity Assessment Lower Extremity Assessment: Overall WFL for tasks assessed   Cervical / Trunk Assessment Cervical / Trunk Assessment: Normal   Communication Communication Communication: No difficulties   Cognition Arousal/Alertness: Awake/alert Behavior During Therapy: WFL for tasks assessed/performed Overall Cognitive Status: Within Functional Limits for tasks assessed                     General Comments       Exercises       Shoulder Instructions      Home Living Family/patient expects to be discharged to:: Private residence  Living Arrangements: Other (Comment) Available Help at Discharge: Available PRN/intermittently Type of Home: House Home Access: Ramped entrance     Home Layout: One level     Bathroom Shower/Tub: Producer, television/film/videoWalk-in shower   Bathroom Toilet: Standard Bathroom Accessibility: Yes       Additional Comments: currently visiting sister who is an amputee adn home is accessible      Prior Functioning/Environment Level of Independence: Independent             OT Diagnosis: Other (comment) (altered sensation)   Luisa DagoHilary Eleuterio Dollar, OTR/L  [phone] 7/3/2017OT Problem List: Impaired sensation   OT Treatment/Interventions:      OT Goals(Current goals can be found in the care plan section) Acute Rehab OT Goals Patient Stated Goal: to go  home OT Goal Formulation: All assessment and education complete, DC therapy  OT Frequency:     Barriers to D/C:            Co-evaluation              End of Session Equipment Utilized During Treatment: Gait belt Nurse Communication: Mobility status;Other (comment) (completed first check off on mobility sheet)  Activity Tolerance: Patient tolerated treatment well Patient left: in chair;with call bell/phone within reach;with nursing/sitter in room   Time: 1150-1205 OT Time Calculation (min): 15 min Charges:  OT General Charges $OT Visit: 1 Procedure OT Evaluation $OT Eval Low Complexity: 1 Procedure G-Codes: OT G-codes **NOT FOR INPATIENT CLASS** Functional Assessment Tool Used: clinical judgemetn Functional Limitation: Self care Self Care Current Status (A5409(G8987): 0 percent impaired, limited or restricted Self Care Goal Status (W1191(G8988): 0 percent impaired, limited or restricted Self Care Discharge Status (Y7829(G8989): 0 percent impaired, limited or restricted  Davontae Prusinski,HILLARY 06/26/2016, 12:12 PM

## 2016-06-26 NOTE — Consult Note (Signed)
Admission H&P    Chief Complaint: New onset left side numbness and slurred speech.  HPI: Allison Schneider is an 70 y.o. female with a history of hypertension currently not on medication, presenting with new onset left-sided numbness and slurred speech. Transient left facial droop was noted by EMS. Patient has no previous history of stroke nor TIA. She has not been on antiplatelet therapy. CT scan of her head showed no acute intracranial abnormality. She did not experience weakness involving left extremities. Speech has returned to normal. NIH stroke score was 2. Blood pressure was elevated at 232/109. She was given labetalol 20 mg IV.  LSN: 3:30 AM on 06/26/2016 tPA Given: No: Minimal deficits mRankin:  Past medical history: Hypertension  No past surgical history on file.  Family history: Reviewed and negative for stroke.  Social History:  has no tobacco, alcohol, and drug history on file.  Allergies: Allergies not on file  Medications: None prior to admission  ROS: History obtained from the patient  General ROS: negative for - chills, fatigue, fever, night sweats, weight gain or weight loss Psychological ROS: negative for - behavioral disorder, hallucinations, memory difficulties, mood swings or suicidal ideation Ophthalmic ROS: negative for - blurry vision, double vision, eye pain or loss of vision ENT ROS: negative for - epistaxis, nasal discharge, oral lesions, sore throat, tinnitus or vertigo Allergy and Immunology ROS: negative for - hives or itchy/watery eyes Hematological and Lymphatic ROS: negative for - bleeding problems, bruising or swollen lymph nodes Endocrine ROS: negative for - galactorrhea, hair pattern changes, polydipsia/polyuria or temperature intolerance Respiratory ROS: negative for - cough, hemoptysis, shortness of breath or wheezing Cardiovascular ROS: negative for - chest pain, dyspnea on exertion, edema or irregular heartbeat Gastrointestinal ROS: negative for  - abdominal pain, diarrhea, hematemesis, nausea/vomiting or stool incontinence Genito-Urinary ROS: negative for - dysuria, hematuria, incontinence or urinary frequency/urgency Musculoskeletal ROS: negative for - joint swelling or muscular weakness Neurological ROS: as noted in HPI Dermatological ROS: negative for rash and skin lesion changes  Physical Examination: Blood pressure 232/109, pulse 71, temperature 98.1 F (36.7 C), temperature source Oral, resp. rate 16, SpO2 99 %.  HEENT-  Normocephalic, no lesions, without obvious abnormality.  Normal external eye and conjunctiva.  Normal TM's bilaterally.  Normal auditory canals and external ears. Normal external nose, mucus membranes and septum.  Normal pharynx. Neck supple with no masses, nodes, nodules or enlargement. Cardiovascular - regular rate and rhythm and systolic murmur: systolic ejection 3/6, crescendo at 2nd left intercostal space Lungs - chest clear, no wheezing, rales, normal symmetric air entry, Heart exam - S1, S2 normal, no murmur, no gallop, rate regular Abdomen - soft, non-tender; bowel sounds normal; no masses,  no organomegaly Extremities - no joint deformities, effusion, or inflammation and no edema  Neurologic Examination: Mental Status: Alert, oriented, thought content appropriate.  Speech fluent without evidence of aphasia. Able to follow commands without difficulty. Cranial Nerves: II-Visual fields were normal. III/IV/VI-Pupils were equal and reacted normally to light. Extraocular movements were full and conjugate.    V/VII-no facial numbness and no facial weakness. VIII-normal. X-normal speech. XI: trapezius strength/neck flexion strength normal bilaterally XII-midline tongue extension with normal strength. Motor: 5/5 bilaterally with normal tone and bulk Sensory: Reduced perception of tactile sensation over left extremities compared to right extremities. Deep Tendon Reflexes: 1+ and symmetric. Plantars:  Flexor bilaterally Cerebellar: Normal finger-to-nose testing. Carotid auscultation: Transmitted systolic murmur bilaterally  Results for orders placed or performed during the hospital encounter of 06/26/16 (  from the past 48 hour(s))  I-Stat Chem 8, ED  (not at [facility], [facility])     Status: Abnormal   Collection Time: 06/26/16  5:18 AM  Result Value Ref Range   Sodium 144 135 - 145 mmol/L   Potassium 3.4 (L) 3.5 - 5.1 mmol/L   Chloride 106 101 - 111 mmol/L   BUN 18 6 - 20 mg/dL   Creatinine, Ser 1.611.00 0.44 - 1.00 mg/dL   Glucose, Bld 096107 (H) 65 - 99 mg/dL   Calcium, Ion 0.451.14 4.091.12 - 1.23 mmol/L   TCO2 28 0 - 100 mmol/L   Hemoglobin 14.6 12.0 - 15.0 g/dL   HCT 81.143.0 91.436.0 - 78.246.0 %  I-stat troponin, ED ([facility])     Status: None   Collection Time: 06/26/16  5:26 AM  Result Value Ref Range   Troponin i, poc 0.01 0.00 - 0.08 ng/mL   Comment 3            Comment: Due to the release kinetics of cTnI, a negative result within the first hours of the onset of symptoms does not rule out myocardial infarction with certainty. If myocardial infarction is still suspected, repeat the test at appropriate intervals.    Ct Head Code Stroke W/o Cm  06/26/2016  CLINICAL DATA:  Code stroke. Acute onset of left-sided numbness. Initial encounter. EXAM: CT HEAD WITHOUT CONTRAST TECHNIQUE: Contiguous axial images were obtained from the base of the skull through the vertex without intravenous contrast. COMPARISON:  None. FINDINGS: There is no evidence of acute infarction, mass lesion, or intra- or extra-axial hemorrhage on CT. Mild periventricular white matter change may reflect small vessel ischemic microangiopathy. The posterior fossa, including the cerebellum, brainstem and fourth ventricle, is within normal limits. The third and lateral ventricles, and basal ganglia are unremarkable in appearance. The cerebral hemispheres are symmetric in appearance, with normal gray-white differentiation. No mass effect or  midline shift is seen. There is no evidence of fracture; visualized osseous structures are unremarkable in appearance. The visualized portions of the orbits are within normal limits. The paranasal sinuses and mastoid air cells are well-aerated. No significant soft tissue abnormalities are seen. IMPRESSION: 1. No acute intracranial pathology seen on CT. 2. Suggestion of mild small vessel ischemic microangiopathy. These results were called by telephone at the time of interpretation on 06/26/2016 at 5:27 am to Dr. Roseanne RenoStewart, who verbally acknowledged these results. Electronically Signed   By: Roanna RaiderJeffery  Chang M.D.   On: 06/26/2016 05:27    Assessment: 70 y.o. female presenting with probable transient ischemic attack or small cortical right MCA territory stroke as well as tensive urgency.  Stroke Risk Factors - hypertension  Plan: 1. HgbA1c, fasting lipid panel 2. MRI, MRA  of the brain without contrast 3. PT consult, OT consult 4. Echocardiogram 5. Carotid dopplers 6. Prophylactic therapy-Antiplatelet med: Aspirin  7. Risk factor modification 8. Telemetry monitoring  C.R. Roseanne RenoStewart, MD [facility] [phone]  06/26/2016, 5:37 AM

## 2016-06-26 NOTE — Progress Notes (Signed)
Received from ED via stretcher; patient is alert and oriented; oriented to room and unit routine. Nephew present at bedside.

## 2016-06-26 NOTE — Plan of Care (Signed)
70 year old female with history of hypertension presents to the ER after patient had sudden onset of slurred speech and left sided numbness at around 4 AM. Patient's blood pressure is markedly elevated in the ER with more than 230 systolic. CT head was unremarkable neurologist was consulted and felt patient was not a candidate for TPA due to minimal neurological changes. Patient has been admitted for further stroke workup. Patient was given 1 dose of labetalol by the neurologist for blood pressure.  Midge MiniumArshad Kakrakandy

## 2016-06-26 NOTE — ED Notes (Signed)
Patient arrived to ED via [facility] from home with numbness in L arm/hand and L leg beginning approx 0330. Denies pain. Speech clear. Still reporting numbness on L side.

## 2016-06-26 NOTE — Progress Notes (Signed)
STROKE TEAM PROGRESS NOTE   HISTORY OF PRESENT ILLNESS (per record) Allison Schneider is an 70 y.o. female with a history of hypertension currently not on medication, presenting with new onset left-sided numbness and slurred speech. Transient left facial droop was noted by EMS. Patient has no previous history of stroke nor TIA. She has not been on antiplatelet therapy. CT scan of her head showed no acute intracranial abnormality. She did not experience weakness involving left extremities. Speech has returned to normal. NIH stroke score was 2. Blood pressure was elevated at 232/109. She was given labetalol 20 mg IV. She was LKW at 3:30 AM on 06/26/2016. Patient was not administered IV t-PA secondary to minimal deficits. She was admitted for further evaluation and treatment.   SUBJECTIVE (INTERVAL HISTORY) Her RN and OT are at the bedside.  Overall she feels her condition is stable. She feels her numbness has resolved. She reported a similar episode Saturday. Today's episode " It lasted a while", unable to quantify time.    OBJECTIVE Temp:  [98.1 F (36.7 C)-98.2 F (36.8 C)] 98.2 F (36.8 C) (07/03 1026) Pulse Rate:  [64-71] 66 (07/03 1026) Cardiac Rhythm:  [-]  Resp:  [12-21] 16 (07/03 1026) BP: (198-232)/(80-110) 212/84 mmHg (07/03 1026) SpO2:  [99 %-100 %] 99 % (07/03 1026)  CBC:   Recent Labs Lab 06/26/16 0515 06/26/16 0518  WBC 5.4  --   NEUTROABS 2.8  --   HGB 13.7 14.6  HCT 43.2 43.0  MCV 79.1  --   PLT 187  --     Basic Metabolic Panel:   Recent Labs Lab 06/26/16 0515 06/26/16 0518  NA 146* 144  K 3.2* 3.4*  CL 100* 106  CO2 27  --   GLUCOSE 112* 107*  BUN 16 18  CREATININE 1.08* 1.00  CALCIUM 10.4*  --     Lipid Panel:     Component Value Date/Time   CHOL 255* 06/26/2016 0515   TRIG 49 06/26/2016 0515   HDL 56 06/26/2016 0515   CHOLHDL 4.6 06/26/2016 0515   VLDL 10 06/26/2016 0515   LDLCALC 189* 06/26/2016 0515   HgbA1c: No results found for:  HGBA1C Urine Drug Screen:     Component Value Date/Time   LABOPIA NONE DETECTED 06/26/2016 0700   COCAINSCRNUR NONE DETECTED 06/26/2016 0700   LABBENZ NONE DETECTED 06/26/2016 0700   AMPHETMU NONE DETECTED 06/26/2016 0700   THCU NONE DETECTED 06/26/2016 0700   LABBARB NONE DETECTED 06/26/2016 0700      IMAGING  Dg Chest 2 View  06/26/2016  IMPRESSION: 1. No active cardiopulmonary disease. 2. Aortic atherosclerosis. Electronically    Mri and Mra Brain Wo Contrast  06/26/2016  IMPRESSION: 1. Evidence of a subtle acute to subacute lacunar infarct in the lateral right thalamus. No associated hemorrhage or mass effect. 2. Severe intracranial atherosclerosis. Moderate irregularity of the proximal Right PCA might relate to the acute finding in #1. There is otherwise moderate or severe stenosis at the: - Right vertebrobasilar junction, - Left PCA P2 segment, - Left ACA A1 segment and bilateral A2 segments, - Left MCA M1 segment, - Right MCA bifurcation. 3. Otherwise relatively little signal changes in the brain suggesting chronic ischemia despite the abnormal MRA appearance. There are several chronic micro hemorrhages in the deep gray matter nuclei.   Ct Head Code Stroke W/o Cm  06/26/2016  IMPRESSION: 1. No acute intracranial pathology seen on CT. 2. Suggestion of mild small vessel ischemic microangiopathy. These results were  called by telephone at the time of interpretation on 06/26/2016 at 5:27 am to Dr. Roseanne RenoStewart, who verbally acknowledged these results.   TTE - Left ventricle: The cavity size was normal. Wall thickness was  increased in a pattern of mild LVH. Systolic function was  vigorous. The estimated ejection fraction was in the range of 65%  to 70%. Wall motion was normal; there were no regional wall  motion abnormalities. Doppler parameters are consistent with  abnormal left ventricular relaxation (grade 1 diastolic  dysfunction). - Aortic valve: There was mild  stenosis. Impressions: - Vigorous LV systolic function; grade 1 diastolic dysfunction;  mild LVH; calcified aortic valve with mild AS (mean gradient 13  mmHg); some of elevated gradient likely related to vigorous LV  function.  CUS - pending   PHYSICAL EXAM  Temp:  [98.1 F (36.7 C)-98.2 F (36.8 C)] 98.2 F (36.8 C) (07/03 1026) Pulse Rate:  [64-71] 70 (07/03 1400) Resp:  [12-21] 18 (07/03 1147) BP: (198-232)/(80-110) 205/82 mmHg (07/03 1400) SpO2:  [99 %-100 %] 99 % (07/03 1400)  General - Well nourished, well developed, in no apparent distress.  Ophthalmologic - Sharp disc margins OU.   Cardiovascular - Regular rate and rhythm.  Mental Status -  Level of arousal and orientation to time, place, and person were intact. Language including expression, naming, repetition, comprehension was assessed and found intact. Fund of Knowledge was assessed and was intact.  Cranial Nerves II - XII - II - Visual field intact OU. III, IV, VI - Extraocular movements intact. V - Facial sensation intact bilaterally. VII - Facial movement intact bilaterally. VIII - Hearing & vestibular intact bilaterally. X - Palate elevates symmetrically. XI - Chin turning & shoulder shrug intact bilaterally. XII - Tongue protrusion intact.  Motor Strength - The patient's strength was normal in all extremities and pronator drift was absent.  Bulk was normal and fasciculations were absent.   Motor Tone - Muscle tone was assessed at the neck and appendages and was normal.  Reflexes - The patient's reflexes were 1+ in all extremities and she had no pathological reflexes.  Sensory - Light touch, temperature/pinprick were assessed and were symmetrical.    Coordination - The patient had normal movements in the hands and feet with no ataxia or dysmetria.  Tremor was absent.  Gait and Station - deferred to PT/OT in room   ASSESSMENT/PLAN [name] is a 70 y.o. female with history of HTN not  taking her medication due to traveling presenting with left sided numbness and slurred speech. She did not receive IV t-PA due to minimal deficits.   Stroke:  R lateral thalamic lacunar infarct secondary to small vessel disease source  Resultant  Dizziness resolved  MRI  R lateral thalamic infarct  MRA  Severe diffuse intracranial stenosis large vessels  Carotid Doppler  pending   2D Echo  EF 65-70%   UDS negative  LDL 189  HgbA1c pending  Lovenox 40 mg sq daily for VTE prophylaxis Diet Heart Room service appropriate?: Yes; Fluid consistency:: Thin  No antithrombotic prior to admission, now on aspirin 325 mg daily. Add plavix given intracranial stenosis x 3 months then plavix alone.  Patient counseled to be compliant with her antithrombotic medications  Ongoing aggressive stroke risk factor management  Therapy recommendations:  pending   Disposition:  pending   Hypertensive Emergency  BP elevated at 232/109 on arrival in setting of neurologic   Patient ordered antihypertensives PTA but she was not taking  Stroke likely started Saturday, so it is OK to start lisinopril as ordered today  Long-term BP goal normotensive  Hyperlipidemia  Home meds:  No statin  LDL 189, goal < 70  Add statin 80mg   Continue statin at discharge  Other Stroke Risk Factors  Advanced age  Obesity, There is no height or weight on file to calculate BMI., recommend weight loss, diet and exercise as appropriate   Other Active Problems  Hypokalemia  AKI  Hospital day # 0  Marvel PlanJindong Maryella Abood, MD PhD Stroke Neurology 06/26/2016 4:54 PM     To contact Stroke Continuity provider, please refer to [email or web address]. After hours, contact General Neurology

## 2016-06-26 NOTE — ED Notes (Signed)
Code Stroke [facility] [phone].

## 2016-06-27 ENCOUNTER — Encounter (HOSPITAL_COMMUNITY): Payer: Self-pay | Admitting: General Practice

## 2016-06-27 ENCOUNTER — Observation Stay (HOSPITAL_COMMUNITY): Payer: Medicare Other

## 2016-06-27 DIAGNOSIS — I10 Essential (primary) hypertension: Secondary | ICD-10-CM | POA: Diagnosis not present

## 2016-06-27 DIAGNOSIS — E876 Hypokalemia: Secondary | ICD-10-CM

## 2016-06-27 DIAGNOSIS — R29810 Facial weakness: Secondary | ICD-10-CM | POA: Diagnosis present

## 2016-06-27 DIAGNOSIS — E7439 Other disorders of intestinal carbohydrate absorption: Secondary | ICD-10-CM | POA: Diagnosis present

## 2016-06-27 DIAGNOSIS — I639 Cerebral infarction, unspecified: Secondary | ICD-10-CM | POA: Insufficient documentation

## 2016-06-27 DIAGNOSIS — N179 Acute kidney failure, unspecified: Secondary | ICD-10-CM | POA: Diagnosis present

## 2016-06-27 DIAGNOSIS — I4510 Unspecified right bundle-branch block: Secondary | ICD-10-CM | POA: Diagnosis present

## 2016-06-27 DIAGNOSIS — I161 Hypertensive emergency: Secondary | ICD-10-CM | POA: Diagnosis present

## 2016-06-27 DIAGNOSIS — R7303 Prediabetes: Secondary | ICD-10-CM | POA: Diagnosis present

## 2016-06-27 DIAGNOSIS — Z6829 Body mass index (BMI) 29.0-29.9, adult: Secondary | ICD-10-CM | POA: Diagnosis not present

## 2016-06-27 DIAGNOSIS — R471 Dysarthria and anarthria: Secondary | ICD-10-CM | POA: Diagnosis present

## 2016-06-27 DIAGNOSIS — I6523 Occlusion and stenosis of bilateral carotid arteries: Secondary | ICD-10-CM | POA: Diagnosis present

## 2016-06-27 DIAGNOSIS — I63511 Cerebral infarction due to unspecified occlusion or stenosis of right middle cerebral artery: Secondary | ICD-10-CM | POA: Diagnosis present

## 2016-06-27 DIAGNOSIS — E785 Hyperlipidemia, unspecified: Secondary | ICD-10-CM | POA: Diagnosis present

## 2016-06-27 DIAGNOSIS — I672 Cerebral atherosclerosis: Secondary | ICD-10-CM | POA: Diagnosis present

## 2016-06-27 DIAGNOSIS — E669 Obesity, unspecified: Secondary | ICD-10-CM | POA: Diagnosis present

## 2016-06-27 DIAGNOSIS — Z9114 Patient's other noncompliance with medication regimen: Secondary | ICD-10-CM | POA: Diagnosis not present

## 2016-06-27 DIAGNOSIS — I160 Hypertensive urgency: Secondary | ICD-10-CM | POA: Diagnosis not present

## 2016-06-27 LAB — BASIC METABOLIC PANEL
Anion gap: 7 (ref 5–15)
BUN: 14 mg/dL (ref 6–20)
CALCIUM: 9.5 mg/dL (ref 8.9–10.3)
CO2: 27 mmol/L (ref 22–32)
CREATININE: 1.03 mg/dL — AB (ref 0.44–1.00)
Chloride: 106 mmol/L (ref 101–111)
GFR, EST NON AFRICAN AMERICAN: 53 mL/min — AB (ref >60)
Glucose, Bld: 90 mg/dL (ref 65–99)
Potassium: 3.8 mmol/L (ref 3.5–5.1)
SODIUM: 140 mmol/L (ref 135–145)

## 2016-06-27 LAB — HEMOGLOBIN A1C
Hgb A1c MFr Bld: 5.9 % — ABNORMAL HIGH (ref 4.8–5.6)
MEAN PLASMA GLUCOSE: 123 mg/dL

## 2016-06-27 NOTE — Progress Notes (Signed)
PROGRESS NOTE  Allison Schneider  ZOX:[id number] DOB: [birth year]  DOA: 06/26/2016 PCP: No PCP Per Patient   Brief Narrative:  70 year old female with PMH of HTN but noncompliant with medications for a long time, lives in the [place] and visits [place] frequently, has not seen PCP for a while, presented to ED on 06/26/16 with 2 days history of recurrent left facial numbness and left-sided tingling, numbness and weakness. Confirmed stroke. Neurology consulted.  Assessment & Plan:   Principal Problem:   Stroke-like symptoms Active Problems:   Stroke (HCC)   Hypokalemia   Hypertension   Acute kidney injury (HCC)   Obesity   Hypercalcemia   Abnormal EKG   Hypertensive urgency   Acute CVA (cerebrovascular accident) (HCC)   Acute right brain stroke (right lateral thalamic lacunar infarct secondary to small vessel disease) - Resultant left facial numbness, dysarthria, facial asymmetry and left-sided numbness/weakness-resolved. - MRI brain: Right lateral thalamic infarct. - MRA brain: Severe diffuse intracranial stenosis large vessels. - Carotid Doppler: 40-59 percent right ICA stenosis, highest end of scale. 1-39 percent left ICA stenosis, highest end of scale. Vertebral artery flow is antegrade. Discussed with neurology, getting CTA neck for further evaluation. - 2-D echo: LVEF 65-70 percent. - UDS negative. - LDL 189 - A1c pending - Not on antithrombotic prior to admission. Now on aspirin 325 MG + Plavix 75 MG daily 3 months then Plavix alone. - PT and OT evaluation: No follow-up  Essential hypertension/hypertensive emergency - Blood pressure 232/109 on arrival. - Noncompliant with antihypertensives prior to admission. - As per neurology, as stroke likely started 06/24/16 and hence okay to start lisinopril. Continue lisinopril 5 MG twice a day and HCTZ 12.5 MG daily. May need further titration.  Hyperlipidemia - LDL 189, goal <70. Started atorvastatin 80 mg daily.  Hypokalemia -  Replaced.  Body mass index is 27.82 kg/(m^2).Carey Bullocks.  Glucose intolerance/prediabetic - Diet, exercise and weight loss. Follow up as outpatient.  Noncompliance - Counseled.   DVT prophylaxis: Lovenox Code Status: Full Family Communication: Discussed at length with patient's son at bedside. Disposition Plan: DC home when medically ready.   Consultants:   Neurology  Procedures:   2-D echo 06/26/16: Study Conclusions  - Left ventricle: The cavity size was normal. Wall thickness was  increased in a pattern of mild LVH. Systolic function was  vigorous. The estimated ejection fraction was in the range of 65%  to 70%. Wall motion was normal; there were no regional wall  motion abnormalities. Doppler parameters are consistent with  abnormal left ventricular relaxation (grade 1 diastolic  dysfunction). - Aortic valve: There was mild stenosis.  Impressions:  - Vigorous LV systolic function; grade 1 diastolic dysfunction;  mild LVH; calcified aortic valve with mild AS (mean gradient 13  mmHg); some of elevated gradient likely related to vigorous LV  function.  Antimicrobials:   None   Subjective: Denies complaints. No recurrence of admission symptoms.  Objective:  Filed Vitals:   06/27/16 0614 06/27/16 0924 06/27/16 1000 06/27/16 1410  BP: 119/100 164/72 182/67 195/81  Pulse: 65  66 68  Temp: 98.4 F (36.9 C)  97.7 F (36.5 C) 98.6 F (37 C)  TempSrc: Oral  Oral Oral  Resp: 16  18 18   Height:      Weight:      SpO2: 100%  99% 99%   No intake or output data in the 24 hours ending 06/27/16 1701 Filed Weights   06/26/16 0806  Weight: 80.6 kg (177 lb  11.1 oz)    Examination:  General exam: Pleasant elderly female sitting up comfortably in bed. Son at bedside. Respiratory system: Clear to auscultation. Respiratory effort normal. Cardiovascular system: S1 & S2 heard, RRR. No JVD, murmurs, rubs, gallops or clicks. No pedal edema. Telemetry: SR  with BBB morphology. Gastrointestinal system: Abdomen is nondistended, soft and nontender. No organomegaly or masses felt. Normal bowel sounds heard. Central nervous system: Alert and oriented. No focal neurological deficits. Extremities: Symmetric 5 x 5 power. No pronator drift. Skin: No rashes, lesions or ulcers Psychiatry: Judgement and insight appear normal. Mood & affect appropriate.     Data Reviewed: I have personally reviewed following labs and imaging studies  CBC:  Recent Labs Lab 06/26/16 0515 06/26/16 0518  WBC 5.4  --   NEUTROABS 2.8  --   HGB 13.7 14.6  HCT 43.2 43.0  MCV 79.1  --   PLT 187  --    Basic Metabolic Panel:  Recent Labs Lab 06/26/16 0515 06/26/16 0518 06/27/16 0722  NA 146* 144 140  K 3.2* 3.4* 3.8  CL 100* 106 106  CO2 27  --  27  GLUCOSE 112* 107* 90  BUN 16 18 14   CREATININE 1.08* 1.00 1.03*  CALCIUM 10.4*  --  9.5   GFR: Estimated Creatinine Clearance: 54.7 mL/min (by C-G formula based on Cr of 1.03). Liver Function Tests:  Recent Labs Lab 06/26/16 0515  AST 24  ALT 19  ALKPHOS 68  BILITOT 0.7  PROT 7.4  ALBUMIN 3.5   No results for input(s): LIPASE, AMYLASE in the last 168 hours. No results for input(s): AMMONIA in the last 168 hours. Coagulation Profile:  Recent Labs Lab 06/26/16 0515  INR 0.99   Cardiac Enzymes: No results for input(s): CKTOTAL, CKMB, CKMBINDEX, TROPONINI in the last 168 hours. BNP (last 3 results) No results for input(s): PROBNP in the last 8760 hours. HbA1C:  Recent Labs  06/26/16 0515  HGBA1C 5.9*   CBG:  Recent Labs Lab 06/26/16 0535 06/26/16 1625  GLUCAP 95 110*   Lipid Profile:  Recent Labs  06/26/16 0515  CHOL 255*  HDL 56  LDLCALC 189*  TRIG 49  CHOLHDL 4.6   Thyroid Function Tests: No results for input(s): TSH, T4TOTAL, FREET4, T3FREE, THYROIDAB in the last 72 hours. Anemia Panel: No results for input(s): VITAMINB12, FOLATE, FERRITIN, TIBC, IRON, RETICCTPCT in  the last 72 hours.  Sepsis Labs: No results for input(s): PROCALCITON, LATICACIDVEN in the last 168 hours.  No results found for this or any previous visit (from the past 240 hour(s)).       Radiology Studies: Dg Chest 2 View  06/26/2016  CLINICAL DATA:  Sudden onset of left-sided weakness and numbness earlier today common no chest complaints EXAM: CHEST  2 VIEW COMPARISON:  None in PACs FINDINGS: The lungs are well-expanded and clear. The heart and pulmonary vascularity are normal. There is calcification in the wall of the aortic arch. There is no pleural effusion or pneumothorax. The bony thorax is unremarkable. IMPRESSION: 1. No active cardiopulmonary disease. 2. Aortic atherosclerosis. Electronically Signed   By: David  SwazilandJordan M.D.   On: 06/26/2016 07:39   Mr Brain Wo Contrast  06/26/2016  CLINICAL DATA:  70 year old female with left side numbness, tingling and weakness. Initial encounter. EXAM: MRI HEAD WITHOUT CONTRAST MRA HEAD WITHOUT CONTRAST TECHNIQUE: Multiplanar, multiecho pulse sequences of the brain and surrounding structures were obtained without intravenous contrast. Angiographic images of the head were obtained using MRA  technique without contrast. COMPARISON:  Head CT without contrast 0522 hours today. FINDINGS: MRI HEAD FINDINGS There is a subtle 4-5 mm area of restricted diffusion suspected along the lateral aspect of the left thalamus (series 5, image 22). Mild underlying T2 heterogeneity in the bilateral thalami. There is a small chronic micro hemorrhage in the medial right thalamus. There is mild if any associated acute T2 and FLAIR hyperintensity. No associated hemorrhage or mass effect. No other restricted diffusion. Major intracranial vascular flow voids are preserved. No midline shift, mass effect, evidence of mass lesion, ventriculomegaly, extra-axial collection or acute intracranial hemorrhage. Cervicomedullary junction and pituitary are within normal limits. Punctate  chronic micro hemorrhages also noted in the lentiform nuclei. No cortical encephalomalacia. Minimal to mild nonspecific cerebral white matter T2 and FLAIR heterogeneity. Negative brainstem and cerebellum. Visible internal auditory structures appear normal. Paranasal sinuses and mastoids are clear. Negative orbit soft tissues aside from postoperative changes to the globes. Negative scalp soft tissues. Chronic disc and endplate degeneration in the cervical spine at C3-C4 appears to result in mild spinal stenosis. Normal bone marrow signal. MRA HEAD FINDINGS Antegrade flow in the posterior circulation, although the distal right vertebral artery is irregular throughout the V4 segment and appears severely stenosed just proximal to the vertebrobasilar junction (Series 805, image 14). There is mild to moderate irregularity in the distal left vertebral artery, but no significant stenosis. The basilar artery remains patent mild irregularity throughout. AICA, SCA, and PCA origins are patent. Posterior communicating arteries are diminutive or absent. There is moderate to severe irregularity and stenosis throughout the left PCA P2 segment (series 805, image 8) with preserved distal flow. There is focal moderate irregularity in the right P1/proximal P2 segment as seen on series 805, image 14. Preserved right PCA distal flow. Antegrade flow in both ICA siphons. Mild siphon irregularity with no stenosis. Ophthalmic artery origins appear normal. Carotid termini are patent. MCA 0 and right ACA origins are within normal limits. There is moderate to severe irregularity and stenosis of the left ACA A1 segment (series 804, image 10). The anterior communicating artery is present. There is widespread irregularity and stenosis throughout the bilateral ACA A2 segments which seem to remain patent. The left MCA is patent but with moderate M1 irregularity (series 804, image 10). Mild to moderate irregularity throughout the visualized left MCA  branches. Right MCA is patent but with severe stenosis just proximal to the right MCA bifurcation. Mildly ectatic appearing bifurcation distal to the stenosis. Visible right MCA branches are patent with mild irregularity. IMPRESSION: 1. Evidence of a subtle acute to subacute lacunar infarct in the lateral right thalamus. No associated hemorrhage or mass effect. 2. Severe intracranial atherosclerosis. Moderate irregularity of the proximal Right PCA might relate to the acute finding in #1. There is otherwise moderate or severe stenosis at the: - Right vertebrobasilar junction, - Left PCA P2 segment, - Left ACA A1 segment and bilateral A2 segments, - Left MCA M1 segment, - Right MCA bifurcation. 3. Otherwise relatively little signal changes in the brain suggesting chronic ischemia despite the abnormal MRA appearance. There are several chronic micro hemorrhages in the deep gray matter nuclei. Electronically Signed   By: Odessa FlemingH  Hall M.D.   On: 06/26/2016 09:28   Mr Maxine GlennMra Head/brain Wo Cm  06/26/2016  CLINICAL DATA:  70 year old female with left side numbness, tingling and weakness. Initial encounter. EXAM: MRI HEAD WITHOUT CONTRAST MRA HEAD WITHOUT CONTRAST TECHNIQUE: Multiplanar, multiecho pulse sequences of the brain and surrounding structures  were obtained without intravenous contrast. Angiographic images of the head were obtained using MRA technique without contrast. COMPARISON:  Head CT without contrast 0522 hours today. FINDINGS: MRI HEAD FINDINGS There is a subtle 4-5 mm area of restricted diffusion suspected along the lateral aspect of the left thalamus (series 5, image 22). Mild underlying T2 heterogeneity in the bilateral thalami. There is a small chronic micro hemorrhage in the medial right thalamus. There is mild if any associated acute T2 and FLAIR hyperintensity. No associated hemorrhage or mass effect. No other restricted diffusion. Major intracranial vascular flow voids are preserved. No midline shift, mass  effect, evidence of mass lesion, ventriculomegaly, extra-axial collection or acute intracranial hemorrhage. Cervicomedullary junction and pituitary are within normal limits. Punctate chronic micro hemorrhages also noted in the lentiform nuclei. No cortical encephalomalacia. Minimal to mild nonspecific cerebral white matter T2 and FLAIR heterogeneity. Negative brainstem and cerebellum. Visible internal auditory structures appear normal. Paranasal sinuses and mastoids are clear. Negative orbit soft tissues aside from postoperative changes to the globes. Negative scalp soft tissues. Chronic disc and endplate degeneration in the cervical spine at C3-C4 appears to result in mild spinal stenosis. Normal bone marrow signal. MRA HEAD FINDINGS Antegrade flow in the posterior circulation, although the distal right vertebral artery is irregular throughout the V4 segment and appears severely stenosed just proximal to the vertebrobasilar junction (Series 805, image 14). There is mild to moderate irregularity in the distal left vertebral artery, but no significant stenosis. The basilar artery remains patent mild irregularity throughout. AICA, SCA, and PCA origins are patent. Posterior communicating arteries are diminutive or absent. There is moderate to severe irregularity and stenosis throughout the left PCA P2 segment (series 805, image 8) with preserved distal flow. There is focal moderate irregularity in the right P1/proximal P2 segment as seen on series 805, image 14. Preserved right PCA distal flow. Antegrade flow in both ICA siphons. Mild siphon irregularity with no stenosis. Ophthalmic artery origins appear normal. Carotid termini are patent. MCA 0 and right ACA origins are within normal limits. There is moderate to severe irregularity and stenosis of the left ACA A1 segment (series 804, image 10). The anterior communicating artery is present. There is widespread irregularity and stenosis throughout the bilateral ACA A2  segments which seem to remain patent. The left MCA is patent but with moderate M1 irregularity (series 804, image 10). Mild to moderate irregularity throughout the visualized left MCA branches. Right MCA is patent but with severe stenosis just proximal to the right MCA bifurcation. Mildly ectatic appearing bifurcation distal to the stenosis. Visible right MCA branches are patent with mild irregularity. IMPRESSION: 1. Evidence of a subtle acute to subacute lacunar infarct in the lateral right thalamus. No associated hemorrhage or mass effect. 2. Severe intracranial atherosclerosis. Moderate irregularity of the proximal Right PCA might relate to the acute finding in #1. There is otherwise moderate or severe stenosis at the: - Right vertebrobasilar junction, - Left PCA P2 segment, - Left ACA A1 segment and bilateral A2 segments, - Left MCA M1 segment, - Right MCA bifurcation. 3. Otherwise relatively little signal changes in the brain suggesting chronic ischemia despite the abnormal MRA appearance. There are several chronic micro hemorrhages in the deep gray matter nuclei. Electronically Signed   By: Odessa Fleming M.D.   On: 06/26/2016 09:28   Ct Head Code Stroke W/o Cm  06/26/2016  CLINICAL DATA:  Code stroke. Acute onset of left-sided numbness. Initial encounter. EXAM: CT HEAD WITHOUT CONTRAST TECHNIQUE: Contiguous  axial images were obtained from the base of the skull through the vertex without intravenous contrast. COMPARISON:  None. FINDINGS: There is no evidence of acute infarction, mass lesion, or intra- or extra-axial hemorrhage on CT. Mild periventricular white matter change may reflect small vessel ischemic microangiopathy. The posterior fossa, including the cerebellum, brainstem and fourth ventricle, is within normal limits. The third and lateral ventricles, and basal ganglia are unremarkable in appearance. The cerebral hemispheres are symmetric in appearance, with normal gray-white differentiation. No mass effect  or midline shift is seen. There is no evidence of fracture; visualized osseous structures are unremarkable in appearance. The visualized portions of the orbits are within normal limits. The paranasal sinuses and mastoid air cells are well-aerated. No significant soft tissue abnormalities are seen. IMPRESSION: 1. No acute intracranial pathology seen on CT. 2. Suggestion of mild small vessel ischemic microangiopathy. These results were called by telephone at the time of interpretation on 06/26/2016 at 5:27 am to Dr. Roseanne Reno, who verbally acknowledged these results. Electronically Signed   By: Roanna Raider M.D.   On: 06/26/2016 05:27        Scheduled Meds: . aspirin  325 mg Oral Daily  . atorvastatin  80 mg Oral q1800  . clopidogrel  75 mg Oral Daily  . enoxaparin (LOVENOX) injection  40 mg Subcutaneous Q24H  . hydrochlorothiazide  12.5 mg Oral Daily  . lisinopril  5 mg Oral BID   Continuous Infusions:    LOS: 1 day    Time spent: 30 minutes.    [facility], MD [facility] Pager [phone]  If 7PM-7AM, please contact night-coverage [email or web address] Password [email or web address] 06/27/2016, 5:01 PM

## 2016-06-27 NOTE — Progress Notes (Signed)
STROKE TEAM PROGRESS NOTE   SUBJECTIVE (INTERVAL HISTORY) Her son is at the bedside.  Her numbness has resolved. Pending CUS. No acute event overnight.   OBJECTIVE Temp:  [97.7 F (36.5 C)-98.6 F (37 C)] 98.6 F (37 C) (07/04 1410) Pulse Rate:  [63-82] 68 (07/04 1410) Cardiac Rhythm:  [-] Normal sinus rhythm;Bundle branch block (07/04 0700) Resp:  [16-18] 18 (07/04 1410) BP: (119-209)/(64-100) 195/81 mmHg (07/04 1410) SpO2:  [99 %-100 %] 99 % (07/04 1410)  CBC:   Recent Labs Lab 06/26/16 0515 06/26/16 0518  WBC 5.4  --   NEUTROABS 2.8  --   HGB 13.7 14.6  HCT 43.2 43.0  MCV 79.1  --   PLT 187  --     Basic Metabolic Panel:   Recent Labs Lab 06/26/16 0515 06/26/16 0518 06/27/16 0722  NA 146* 144 140  K 3.2* 3.4* 3.8  CL 100* 106 106  CO2 27  --  27  GLUCOSE 112* 107* 90  BUN CREATININE 1.08* 1.00 1.03*  CALCIUM 10.4*  --  9.5    Lipid Panel:     Component Value Date/Time   CHOL 255* 06/26/2016 0515   TRIG 49 06/26/2016 0515   HDL 56 06/26/2016 0515   CHOLHDL 4.6 06/26/2016 0515   VLDL 10 06/26/2016 0515   LDLCALC 189* 06/26/2016 0515   HgbA1c:  Lab Results  Component Value Date   HGBA1C 5.9* 06/26/2016   Urine Drug Screen:     Component Value Date/Time   LABOPIA NONE DETECTED 06/26/2016 0700   COCAINSCRNUR NONE DETECTED 06/26/2016 0700   LABBENZ NONE DETECTED 06/26/2016 0700   AMPHETMU NONE DETECTED 06/26/2016 0700   THCU NONE DETECTED 06/26/2016 0700   LABBARB NONE DETECTED 06/26/2016 0700      IMAGING I have personally reviewed the radiological images below and agree with the radiology interpretations.  Dg Chest 2 View  06/26/2016  IMPRESSION: 1. No active cardiopulmonary disease. 2. Aortic atherosclerosis. Electronically    Mri and Mra Brain Wo Contrast  06/26/2016  IMPRESSION: 1. Evidence of a subtle acute to subacute lacunar infarct in the lateral right thalamus. No associated hemorrhage or mass effect. 2. Severe  intracranial atherosclerosis. Moderate irregularity of the proximal Right PCA might relate to the acute finding in #1. There is otherwise moderate or severe stenosis at the: - Right vertebrobasilar junction, - Left PCA P2 segment, - Left ACA A1 segment and bilateral A2 segments, - Left MCA M1 segment, - Right MCA bifurcation. 3. Otherwise relatively little signal changes in the brain suggesting chronic ischemia despite the abnormal MRA appearance. There are several chronic micro hemorrhages in the deep gray matter nuclei.   Ct Head Code Stroke W/o Cm  06/26/2016  IMPRESSION: 1. No acute intracranial pathology seen on CT. 2. Suggestion of mild small vessel ischemic microangiopathy. These results were called by telephone at the time of interpretation on 06/26/2016 at 5:27 am to Dr. Roseanne Reno, who verbally acknowledged these results.   TTE - Left ventricle: The cavity size was normal. Wall thickness was  increased in a pattern of mild LVH. Systolic function was  vigorous. The estimated ejection fraction was in the range of 65%  to 70%. Wall motion was normal; there were no regional wall  motion abnormalities. Doppler parameters are consistent with  abnormal left ventricular relaxation (grade 1 diastolic  dysfunction). - Aortic valve: There was mild stenosis. Impressions: - Vigorous LV systolic function; grade 1 diastolic dysfunction;  mild LVH;  calcified aortic valve with mild AS (mean gradient 13  mmHg); some of elevated gradient likely related to vigorous LV  function.  CUS - 40-59% right ICA stenosis, highest end of scale. 1-39% left ICA stenosis, highest end of scale. Vertebral artery flow is antegrade.   CTA neck - pending   PHYSICAL EXAM  Temp:  [97.7 F (36.5 C)-98.6 F (37 C)] 98.6 F (37 C) (07/04 1410) Pulse Rate:  [63-82] 68 (07/04 1410) Resp:  [16-18] 18 (07/04 1410) BP: (119-209)/(64-100) 195/81 mmHg (07/04 1410) SpO2:  [99 %-100 %] 99 % (07/04 1410)  General - Well  nourished, well developed, in no apparent distress.  Ophthalmologic - Sharp disc margins OU.   Cardiovascular - Regular rate and rhythm.  Mental Status -  Level of arousal and orientation to time, place, and person were intact. Language including expression, naming, repetition, comprehension was assessed and found intact. Fund of Knowledge was assessed and was intact.  Cranial Nerves II - XII - II - Visual field intact OU. III, IV, VI - Extraocular movements intact. V - Facial sensation intact bilaterally. VII - Facial movement intact bilaterally. VIII - Hearing & vestibular intact bilaterally. X - Palate elevates symmetrically. XI - Chin turning & shoulder shrug intact bilaterally. XII - Tongue protrusion intact.  Motor Strength - The patient's strength was normal in all extremities and pronator drift was absent.  Bulk was normal and fasciculations were absent.   Motor Tone - Muscle tone was assessed at the neck and appendages and was normal.  Reflexes - The patient's reflexes were 1+ in all extremities and she had no pathological reflexes.  Sensory - Light touch, temperature/pinprick were assessed and were symmetrical.    Coordination - The patient had normal movements in the hands and feet with no ataxia or dysmetria.  Tremor was absent.  Gait and Station - deferred to PT/OT in room   ASSESSMENT/PLAN [name] is a 70 y.o. female with history of HTN not taking her medication due to traveling presenting with left sided numbness and slurred speech. She did not receive IV t-PA due to minimal deficits.   Stroke:  R lateral thalamic lacunar infarct secondary to small vessel disease source  Resultant  Dizziness resolved  MRI  R lateral thalamic infarct  MRA  Severe diffuse intracranial stenosis large vessels  Carotid Doppler  Right ICA 40-59% stenosis  CTA neck - pending  2D Echo  EF 65-70%   UDS negative  LDL 189  HgbA1c pending  Lovenox 40 mg sq daily for  VTE prophylaxis Diet Heart Room service appropriate?: Yes; Fluid consistency:: Thin  No antithrombotic prior to admission, now on aspirin 325 mg daily. Add plavix given intracranial stenosis x 3 months then plavix alone.  Patient counseled to be compliant with her antithrombotic medications  Ongoing aggressive stroke risk factor management  Therapy recommendations:  pending   Disposition:  pending   Hypertensive Emergency  BP elevated at 232/109 on arrival in setting of neurologic   Patient ordered antihypertensives PTA but she was not taking  Stroke likely started Saturday, so it is OK to start lisinopril as ordered today  Long-term BP goal normotensive  Hyperlipidemia  Home meds:  No statin  LDL 189, goal < 70  Add statin 80mg   Continue statin at discharge  Other Stroke Risk Factors  Advanced age  Obesity, Body mass index is 27.82 kg/(m^2)., recommend weight loss, diet and exercise as appropriate   Other Active Problems  Hypokalemia  AKI  Hospital day # 1  Marvel PlanJindong Icel Castles, MD PhD Stroke Neurology 06/27/2016 5:02 PM     To contact Stroke Continuity provider, please refer to [email or web address]. After hours, contact General Neurology

## 2016-06-27 NOTE — Progress Notes (Signed)
VASCULAR LAB PRELIMINARY  PRELIMINARY  PRELIMINARY  PRELIMINARY  Carotid duplex completed.    Preliminary report:  40-59% right ICA stenosis, highest end of scale. 1-39% left ICA stenosis, highest end of scale.  Vertebral artery flow is antegrade.   Gregory Dowe, RVT 06/27/2016, 12:39 PM

## 2016-06-28 ENCOUNTER — Encounter (HOSPITAL_COMMUNITY): Payer: Self-pay | Admitting: *Deleted

## 2016-06-28 ENCOUNTER — Inpatient Hospital Stay (HOSPITAL_COMMUNITY): Payer: Medicare Other

## 2016-06-28 DIAGNOSIS — E785 Hyperlipidemia, unspecified: Secondary | ICD-10-CM | POA: Insufficient documentation

## 2016-06-28 DIAGNOSIS — I160 Hypertensive urgency: Secondary | ICD-10-CM

## 2016-06-28 MED ORDER — LISINOPRIL 10 MG PO TABS: 10.0000 mg | ORAL_TABLET | Freq: Every day | ORAL | Status: DC

## 2016-06-28 MED ORDER — ASPIRIN 325 MG PO TABS: 325.0000 mg | ORAL_TABLET | Freq: Every day | ORAL | Status: DC

## 2016-06-28 MED ORDER — IOPAMIDOL (ISOVUE-370) INJECTION 76%
INTRAVENOUS | Status: AC
  Administered 2016-06-28: 50 mL
  Filled 2016-06-28: qty 50

## 2016-06-28 MED ORDER — CLOPIDOGREL BISULFATE 75 MG PO TABS: 75.0000 mg | ORAL_TABLET | Freq: Every day | ORAL | Status: AC

## 2016-06-28 MED ORDER — ATORVASTATIN CALCIUM 80 MG PO TABS: 80.0000 mg | ORAL_TABLET | Freq: Every day | ORAL | Status: DC

## 2016-06-28 MED ORDER — HYDROCHLOROTHIAZIDE 12.5 MG PO CAPS: 12.5000 mg | ORAL_CAPSULE | Freq: Every day | ORAL | Status: DC

## 2016-06-28 NOTE — Progress Notes (Signed)
STROKE TEAM PROGRESS NOTE   SUBJECTIVE (INTERVAL HISTORY) No family is at the bedside.  Her numbness has resolved. CUS showed right ICA 40-59% stenosis. No acute event overnight. CTA neck showed right ICA less than 50% stenosis.    OBJECTIVE Temp:  [98.3 F (36.8 C)-99.4 F (37.4 C)] 99.4 F (37.4 C) (07/05 1427) Pulse Rate:  [70-84] 80 (07/05 1427) Cardiac Rhythm:  [-] Normal sinus rhythm;Bundle branch block (07/05 0704) Resp:  [16-18] 18 (07/05 1427) BP: (132-198)/(58-97) 152/73 mmHg (07/05 1427) SpO2:  [97 %-100 %] 100 % (07/05 1427) Weight:  [200 lb 13.4 oz (91.1 kg)] 200 lb 13.4 oz (91.1 kg) (07/05 0500)  CBC:   Recent Labs Lab 06/26/16 0515 06/26/16 0518  WBC 5.4  --   NEUTROABS 2.8  --   HGB 13.7 14.6  HCT 43.2 43.0  MCV 79.1  --   PLT 187  --     Basic Metabolic Panel:   Recent Labs Lab 06/26/16 0515 06/26/16 0518 06/27/16 0722  NA 146* 144 140  K 3.2* 3.4* 3.8  CL 100* 106 106  CO2 27  --  27  GLUCOSE 112* 107* 90  BUN 16 18 14   CREATININE 1.08* 1.00 1.03*  CALCIUM 10.4*  --  9.5    Lipid Panel:     Component Value Date/Time   CHOL 255* 06/26/2016 0515   TRIG 49 06/26/2016 0515   HDL 56 06/26/2016 0515   CHOLHDL 4.6 06/26/2016 0515   VLDL 10 06/26/2016 0515   LDLCALC 189* 06/26/2016 0515   HgbA1c:  Lab Results  Component Value Date   HGBA1C 5.9* 06/26/2016   Urine Drug Screen:     Component Value Date/Time   LABOPIA NONE DETECTED 06/26/2016 0700   COCAINSCRNUR NONE DETECTED 06/26/2016 0700   LABBENZ NONE DETECTED 06/26/2016 0700   AMPHETMU NONE DETECTED 06/26/2016 0700   THCU NONE DETECTED 06/26/2016 0700   LABBARB NONE DETECTED 06/26/2016 0700      IMAGING I have personally reviewed the radiological images below and agree with the radiology interpretations.  Dg Chest 2 View  06/26/2016  IMPRESSION: 1. No active cardiopulmonary disease. 2. Aortic atherosclerosis. Electronically    Mri and Mra Brain Wo Contrast  06/26/2016   IMPRESSION: 1. Evidence of a subtle acute to subacute lacunar infarct in the lateral right thalamus. No associated hemorrhage or mass effect. 2. Severe intracranial atherosclerosis. Moderate irregularity of the proximal Right PCA might relate to the acute finding in #1. There is otherwise moderate or severe stenosis at the: - Right vertebrobasilar junction, - Left PCA P2 segment, - Left ACA A1 segment and bilateral A2 segments, - Left MCA M1 segment, - Right MCA bifurcation. 3. Otherwise relatively little signal changes in the brain suggesting chronic ischemia despite the abnormal MRA appearance. There are several chronic micro hemorrhages in the deep gray matter nuclei.   Ct Head Code Stroke W/o Cm  06/26/2016  IMPRESSION: 1. No acute intracranial pathology seen on CT. 2. Suggestion of mild small vessel ischemic microangiopathy. These results were called by telephone at the time of interpretation on 06/26/2016 at 5:27 am to Dr. Roseanne RenoStewart, who verbally acknowledged these results.   TTE - Left ventricle: The cavity size was normal. Wall thickness was  increased in a pattern of mild LVH. Systolic function was  vigorous. The estimated ejection fraction was in the range of 65%  to 70%. Wall motion was normal; there were no regional wall  motion abnormalities. Doppler parameters are consistent with  abnormal left ventricular relaxation (grade 1 diastolic  dysfunction). - Aortic valve: There was mild stenosis. Impressions: - Vigorous LV systolic function; grade 1 diastolic dysfunction;  mild LVH; calcified aortic valve with mild AS (mean gradient 13  mmHg); some of elevated gradient likely related to vigorous LV  function.  CUS - 40-59% right ICA stenosis, highest end of scale. 1-39% left ICA stenosis, highest end of scale. Vertebral artery flow is antegrade.   CTA neck  06/28/2016  IMPRESSION: Atherosclerosis resulting in less than 50% stenosis bilateral internal carotid artery origins. Moderate  stenosis LEFT mid cervical internal carotid artery. Severe luminal irregularity bilateral cervical internal carotid arteries and moderate luminal irregularity vertebral arteries likely reflecting atherosclerosis though there may be an underlying component of fibromuscular dysplasia. Severe stenosis RIGHT vertebral artery origin.    PHYSICAL EXAM  Temp:  [98.3 F (36.8 C)-99.4 F (37.4 C)] 99.4 F (37.4 C) (07/05 1427) Pulse Rate:  [70-84] 80 (07/05 1427) Resp:  [16-18] 18 (07/05 1427) BP: (132-198)/(58-97) 152/73 mmHg (07/05 1427) SpO2:  [97 %-100 %] 100 % (07/05 1427) Weight:  [200 lb 13.4 oz (91.1 kg)] 200 lb 13.4 oz (91.1 kg) (07/05 0500)  General - Well nourished, well developed, in no apparent distress.  Ophthalmologic - Sharp disc margins OU.   Cardiovascular - Regular rate and rhythm.  Mental Status -  Level of arousal and orientation to time, place, and person were intact. Language including expression, naming, repetition, comprehension was assessed and found intact. Fund of Knowledge was assessed and was intact.  Cranial Nerves II - XII - II - Visual field intact OU. III, IV, VI - Extraocular movements intact. V - Facial sensation intact bilaterally. VII - Facial movement intact bilaterally. VIII - Hearing & vestibular intact bilaterally. X - Palate elevates symmetrically. XI - Chin turning & shoulder shrug intact bilaterally. XII - Tongue protrusion intact.  Motor Strength - The patient's strength was normal in all extremities and pronator drift was absent.  Bulk was normal and fasciculations were absent.   Motor Tone - Muscle tone was assessed at the neck and appendages and was normal.  Reflexes - The patient's reflexes were 1+ in all extremities and she had no pathological reflexes.  Sensory - Light touch, temperature/pinprick were assessed and were symmetrical.    Coordination - The patient had normal movements in the hands and feet with no ataxia or dysmetria.   Tremor was absent.  Gait and Station - deferred to PT/OT in room   ASSESSMENT/PLAN [name] is a 70 y.o. female with history of HTN not taking her medication due to traveling presenting with left sided numbness and slurred speech. She did not receive IV t-PA due to minimal deficits.   Stroke:  R lateral thalamic lacunar infarct secondary to small vessel disease source  Resultant  Dizziness resolved  MRI  R lateral thalamic infarct  MRA  Severe diffuse intracranial stenosis large vessels  Carotid Doppler  Right ICA 40-59% stenosis  CTA neck - b/l ICA < 50% stenosis, b/l VA origin stenosis  2D Echo  EF 65-70%   UDS negative  LDL 189  HgbA1c 5.9  Lovenox 40 mg sq daily for VTE prophylaxis Diet Heart Room service appropriate?: Yes; Fluid consistency:: Thin Diet - low sodium heart healthy  No antithrombotic prior to admission, now on aspirin 325 mg daily. Add plavix given intracranial stenosis x 3 months then plavix alone.  Patient counseled to be compliant with her antithrombotic medications  Ongoing aggressive  stroke risk factor management  Therapy recommendations:  pending   Disposition:  pending   Hypertensive Emergency  BP elevated at 232/109 on arrival in setting of neurologic  Long-term BP goal normotensive  Hyperlipidemia  Home meds:  No statin  LDL 189, goal < 70  Add statin   Continue statin at discharge  Other Stroke Risk Factors  Advanced age  Obesity, Body mass index is 31.45 kg/(m^2)., recommend weight loss, diet and exercise as appropriate   Other Active Problems  Hypokalemia  AKI  Hospital day # 2  Neurology will sign off. Please call with questions. Pt will follow up with Dr. Darrol Angel at [facility] in about 2 months. Thanks for the consult.   Marvel Plan, MD PhD Stroke Neurology 06/28/2016 4:46 PM     To contact Stroke Continuity provider, please refer to [email or web address]. After hours, contact General Neurology

## 2016-06-28 NOTE — Care Management Note (Signed)
Case Management Note  Patient Details  Name: Allison Schneider MRN: [id number] Date of Birth: 04/27/1946  Subjective/Objective:                    Action/Plan: Pt discharging home with self care. [place] PCP in the [place]. CM provided her with a list of MD's [place] and in the [place] area per her request.   Expected Discharge Date:                  Expected Discharge Plan:  Home w Home Health Services  In-House Referral:     Discharge planning Services  CM Consult  Post Acute Care Choice:    Choice offered to:     DME Arranged:    DME Agency:     HH Arranged:    HH Agency:     Status of Service:  Completed, signed off  If discussed at [employer] Meetings, dates discussed:    Additional Comments:  Kermit BaloKelli F Yeiden Frenkel, RN 06/28/2016, 4:03 PM

## 2016-06-28 NOTE — Discharge Summary (Signed)
Physician Discharge Summary  Allison Schneider ZOX:[id number] DOB: Nov 17, 1946 DOA: 06/26/2016  PCP: No PCP Per Patient  Admit date: 06/26/2016 Discharge date: 06/28/2016  Admitted From: Home Disposition:  Home  Recommendations for Outpatient Follow-up:  1. Follow up with PCP in 1-2 weeks 2. Please obtain BMP/CBC in one week   Home Health:No Equipment/Devices: NONE  Discharge Condition:stable CODE STATUS:FULL Diet recommendation: Heart Healthy  Brief/Interim Summary: 70 year old female with PMH of HTN but noncompliant with medications for a long time, lives in the [place] and visits [place] frequently, has not seen PCP for a while, presented to ED on 06/26/16 with 2 days history of recurrent left facial numbness and left-sided tingling, numbness and weakness. Confirmed stroke. Neurology consulted.  Discharge Diagnoses:  Acute right brain stroke (right lateral thalamic lacunar infarct secondary to small vessel disease) - Resultant left facial numbness, dysarthria, facial asymmetry and left-sided numbness/weakness-resolved. - MRI brain: Right lateral thalamic infarct. - MRA brain: Severe diffuse intracranial stenosis large vessels. - Carotid Doppler: 40-59 percent right ICA stenosis, highest end of scale. 1-39 percent left ICA stenosis, highest end of scale. Vertebral artery flow is antegrade. Discussed with neurology, getting CTA neck for further evaluation. -CTA neck--<50% ICA stenosis;  Severe R-vertebral stenosis - 2-D echo: LVEF 65-70 percent, no WMA - UDS negative. - LDL 189 - A1c 5.9 - Not on antithrombotic prior to admission. Now on aspirin 325 MG + Plavix 75 MG daily 3 months then Plavix alone. - PT and OT evaluation: No follow-up  Essential hypertension/hypertensive emergency - Blood pressure 232/109 on arrival. - Noncompliant with antihypertensives prior to admission. - As per neurology, as stroke likely started 06/24/16 and hence okay to start lisinopril. Continue lisinopril 10  mg daily and HCTZ 12.5 MG daily. May need further titration.  Hyperlipidemia - LDL 189, goal <70. Started atorvastatin 80 mg daily.  Hypokalemia - Replaced.  Body mass index is 27.82 kg/(m^2).Carey Bullocks.  Glucose intolerance/prediabetic - Diet, exercise and weight loss. Follow up as outpatient. -HbA1C--5.9  Noncompliance - Counseled.   Discharge Instructions  Discharge Instructions    Diet - low sodium heart healthy    Complete by:  As directed      Increase activity slowly    Complete by:  As directed             Medication List    TAKE these medications        aspirin 325 MG tablet  Take 1 tablet (325 mg total) by mouth daily. X 90 days     atorvastatin 80 MG tablet  Commonly known as:  LIPITOR  Take 1 tablet (80 mg total) by mouth daily at 6 PM.     clopidogrel 75 MG tablet  Commonly known as:  PLAVIX  Take 1 tablet (75 mg total) by mouth daily.     hydrochlorothiazide 12.5 MG capsule  Commonly known as:  MICROZIDE  Take 1 capsule (12.5 mg total) by mouth daily.     lisinopril 10 MG tablet  Commonly known as:  PRINIVIL,ZESTRIL  Take 1 tablet (10 mg total) by mouth daily.        No Known Allergies  Consultations:  Neurology   Procedures/Studies: Dg Chest 2 View  06/26/2016  CLINICAL DATA:  Sudden onset of left-sided weakness and numbness earlier today common no chest complaints EXAM: CHEST  2 VIEW COMPARISON:  None in PACs FINDINGS: The lungs are well-expanded and clear. The heart and pulmonary vascularity are normal. There is calcification in the wall of  the aortic arch. There is no pleural effusion or pneumothorax. The bony thorax is unremarkable. IMPRESSION: 1. No active cardiopulmonary disease. 2. Aortic atherosclerosis. Electronically Signed   By: Asmaa Tirpak  [place] M.D.   On: 06/26/2016 07:39   Ct Angio Neck W Or Wo Contrast  06/28/2016  CLINICAL DATA:  LEFT-sided weakness, status post tPA. History of hypertension. EXAM: CT ANGIOGRAPHY NECK  TECHNIQUE: Multidetector CT imaging of the neck was performed using the standard protocol during bolus administration of intravenous contrast. Multiplanar CT image reconstructions and MIPs were obtained to evaluate the vascular anatomy. Carotid stenosis measurements (when applicable) are obtained utilizing NASCET criteria, using the distal internal carotid diameter as the denominator. CONTRAST:  50 cc Isovue 370 COMPARISON:  MRI and MRA head June 26, 2016 FINDINGS: Aortic arch: Normal appearance of the thoracic arch, normal branch pattern. Moderate calcific atherosclerosis of the aortic arch. The origins of the innominate, left Common carotid artery and subclavian artery are widely patent. Mild calcific atherosclerosis RIGHT subclavian artery with tandem mild stenoses. Right carotid system: Common carotid artery is widely patent, coursing in a straight line fashion. Eccentric calcific atherosclerosis resulting in less than 50% stenosis. Severe luminal irregularity of the cervical internal carotid artery without focal stenosis. Left carotid system: Common carotid artery is widely patent, coursing in a straight line fashion. Eccentric calcific atherosclerosis resulting in less than 50% stenosis. Severe luminal irregularity of the cervical internal carotid artery with moderate focal stenosis LEFT mid cervical internal carotid artery. Vertebral arteries:Calcific atherosclerosis resulting in severe stenosis RIGHT vertebral artery origin. Moderate generalized luminal regularity. Skeleton: No acute osseous process though bone windows have not been submitted. Moderate to severe C3-4 thru C6-7 degenerative discs. Other neck: Soft tissues of the neck are nonacute though, not tailored for evaluation. 11 mm LEFT thyroid nodule is below size followup recommendation. Additional less than 1 cm thyroid nodules and coarse calcifications. IMPRESSION: Atherosclerosis resulting in less than 50% stenosis bilateral internal carotid artery  origins. Moderate stenosis LEFT mid cervical internal carotid artery. Severe luminal irregularity bilateral cervical internal carotid arteries and moderate luminal irregularity vertebral arteries likely reflecting atherosclerosis though there may be an underlying component of fibromuscular dysplasia. Severe stenosis RIGHT vertebral artery origin. Electronically Signed   By: Awilda Metro M.D.   On: 06/28/2016 06:14   Mr Brain Wo Contrast  06/26/2016  CLINICAL DATA:  70 year old female with left side numbness, tingling and weakness. Initial encounter. EXAM: MRI HEAD WITHOUT CONTRAST MRA HEAD WITHOUT CONTRAST TECHNIQUE: Multiplanar, multiecho pulse sequences of the brain and surrounding structures were obtained without intravenous contrast. Angiographic images of the head were obtained using MRA technique without contrast. COMPARISON:  Head CT without contrast 0522 hours today. FINDINGS: MRI HEAD FINDINGS There is a subtle 4-5 mm area of restricted diffusion suspected along the lateral aspect of the left thalamus (series 5, image 22). Mild underlying T2 heterogeneity in the bilateral thalami. There is a small chronic micro hemorrhage in the medial right thalamus. There is mild if any associated acute T2 and FLAIR hyperintensity. No associated hemorrhage or mass effect. No other restricted diffusion. Major intracranial vascular flow voids are preserved. No midline shift, mass effect, evidence of mass lesion, ventriculomegaly, extra-axial collection or acute intracranial hemorrhage. Cervicomedullary junction and pituitary are within normal limits. Punctate chronic micro hemorrhages also noted in the lentiform nuclei. No cortical encephalomalacia. Minimal to mild nonspecific cerebral white matter T2 and FLAIR heterogeneity. Negative brainstem and cerebellum. Visible internal auditory structures appear normal. Paranasal sinuses and mastoids are  clear. Negative orbit soft tissues aside from postoperative changes to  the globes. Negative scalp soft tissues. Chronic disc and endplate degeneration in the cervical spine at C3-C4 appears to result in mild spinal stenosis. Normal bone marrow signal. MRA HEAD FINDINGS Antegrade flow in the posterior circulation, although the distal right vertebral artery is irregular throughout the V4 segment and appears severely stenosed just proximal to the vertebrobasilar junction (Series 805, image 14). There is mild to moderate irregularity in the distal left vertebral artery, but no significant stenosis. The basilar artery remains patent mild irregularity throughout. AICA, SCA, and PCA origins are patent. Posterior communicating arteries are diminutive or absent. There is moderate to severe irregularity and stenosis throughout the left PCA P2 segment (series 805, image 8) with preserved distal flow. There is focal moderate irregularity in the right P1/proximal P2 segment as seen on series 805, image 14. Preserved right PCA distal flow. Antegrade flow in both ICA siphons. Mild siphon irregularity with no stenosis. Ophthalmic artery origins appear normal. Carotid termini are patent. MCA 0 and right ACA origins are within normal limits. There is moderate to severe irregularity and stenosis of the left ACA A1 segment (series 804, image 10). The anterior communicating artery is present. There is widespread irregularity and stenosis throughout the bilateral ACA A2 segments which seem to remain patent. The left MCA is patent but with moderate M1 irregularity (series 804, image 10). Mild to moderate irregularity throughout the visualized left MCA branches. Right MCA is patent but with severe stenosis just proximal to the right MCA bifurcation. Mildly ectatic appearing bifurcation distal to the stenosis. Visible right MCA branches are patent with mild irregularity. IMPRESSION: 1. Evidence of a subtle acute to subacute lacunar infarct in the lateral right thalamus. No associated hemorrhage or mass effect.  2. Severe intracranial atherosclerosis. Moderate irregularity of the proximal Right PCA might relate to the acute finding in #1. There is otherwise moderate or severe stenosis at the: - Right vertebrobasilar junction, - Left PCA P2 segment, - Left ACA A1 segment and bilateral A2 segments, - Left MCA M1 segment, - Right MCA bifurcation. 3. Otherwise relatively little signal changes in the brain suggesting chronic ischemia despite the abnormal MRA appearance. There are several chronic micro hemorrhages in the deep gray matter nuclei. Electronically Signed   By: Odessa Fleming M.D.   On: 06/26/2016 09:28   Mr Maxine Glenn Head/brain Wo Cm  06/26/2016  CLINICAL DATA:  70 year old female with left side numbness, tingling and weakness. Initial encounter. EXAM: MRI HEAD WITHOUT CONTRAST MRA HEAD WITHOUT CONTRAST TECHNIQUE: Multiplanar, multiecho pulse sequences of the brain and surrounding structures were obtained without intravenous contrast. Angiographic images of the head were obtained using MRA technique without contrast. COMPARISON:  Head CT without contrast 0522 hours today. FINDINGS: MRI HEAD FINDINGS There is a subtle 4-5 mm area of restricted diffusion suspected along the lateral aspect of the left thalamus (series 5, image 22). Mild underlying T2 heterogeneity in the bilateral thalami. There is a small chronic micro hemorrhage in the medial right thalamus. There is mild if any associated acute T2 and FLAIR hyperintensity. No associated hemorrhage or mass effect. No other restricted diffusion. Major intracranial vascular flow voids are preserved. No midline shift, mass effect, evidence of mass lesion, ventriculomegaly, extra-axial collection or acute intracranial hemorrhage. Cervicomedullary junction and pituitary are within normal limits. Punctate chronic micro hemorrhages also noted in the lentiform nuclei. No cortical encephalomalacia. Minimal to mild nonspecific cerebral white matter T2 and FLAIR heterogeneity. Negative  brainstem and cerebellum. Visible internal auditory structures appear normal. Paranasal sinuses and mastoids are clear. Negative orbit soft tissues aside from postoperative changes to the globes. Negative scalp soft tissues. Chronic disc and endplate degeneration in the cervical spine at C3-C4 appears to result in mild spinal stenosis. Normal bone marrow signal. MRA HEAD FINDINGS Antegrade flow in the posterior circulation, although the distal right vertebral artery is irregular throughout the V4 segment and appears severely stenosed just proximal to the vertebrobasilar junction (Series 805, image 14). There is mild to moderate irregularity in the distal left vertebral artery, but no significant stenosis. The basilar artery remains patent mild irregularity throughout. AICA, SCA, and PCA origins are patent. Posterior communicating arteries are diminutive or absent. There is moderate to severe irregularity and stenosis throughout the left PCA P2 segment (series 805, image 8) with preserved distal flow. There is focal moderate irregularity in the right P1/proximal P2 segment as seen on series 805, image 14. Preserved right PCA distal flow. Antegrade flow in both ICA siphons. Mild siphon irregularity with no stenosis. Ophthalmic artery origins appear normal. Carotid termini are patent. MCA 0 and right ACA origins are within normal limits. There is moderate to severe irregularity and stenosis of the left ACA A1 segment (series 804, image 10). The anterior communicating artery is present. There is widespread irregularity and stenosis throughout the bilateral ACA A2 segments which seem to remain patent. The left MCA is patent but with moderate M1 irregularity (series 804, image 10). Mild to moderate irregularity throughout the visualized left MCA branches. Right MCA is patent but with severe stenosis just proximal to the right MCA bifurcation. Mildly ectatic appearing bifurcation distal to the stenosis. Visible right MCA  branches are patent with mild irregularity. IMPRESSION: 1. Evidence of a subtle acute to subacute lacunar infarct in the lateral right thalamus. No associated hemorrhage or mass effect. 2. Severe intracranial atherosclerosis. Moderate irregularity of the proximal Right PCA might relate to the acute finding in #1. There is otherwise moderate or severe stenosis at the: - Right vertebrobasilar junction, - Left PCA P2 segment, - Left ACA A1 segment and bilateral A2 segments, - Left MCA M1 segment, - Right MCA bifurcation. 3. Otherwise relatively little signal changes in the brain suggesting chronic ischemia despite the abnormal MRA appearance. There are several chronic micro hemorrhages in the deep gray matter nuclei. Electronically Signed   By: Odessa FlemingH  Hall M.D.   On: 06/26/2016 09:28   Ct Head Code Stroke W/o Cm  06/26/2016  CLINICAL DATA:  Code stroke. Acute onset of left-sided numbness. Initial encounter. EXAM: CT HEAD WITHOUT CONTRAST TECHNIQUE: Contiguous axial images were obtained from the base of the skull through the vertex without intravenous contrast. COMPARISON:  None. FINDINGS: There is no evidence of acute infarction, mass lesion, or intra- or extra-axial hemorrhage on CT. Mild periventricular white matter change may reflect small vessel ischemic microangiopathy. The posterior fossa, including the cerebellum, brainstem and fourth ventricle, is within normal limits. The third and lateral ventricles, and basal ganglia are unremarkable in appearance. The cerebral hemispheres are symmetric in appearance, with normal gray-white differentiation. No mass effect or midline shift is seen. There is no evidence of fracture; visualized osseous structures are unremarkable in appearance. The visualized portions of the orbits are within normal limits. The paranasal sinuses and mastoid air cells are well-aerated. No significant soft tissue abnormalities are seen. IMPRESSION: 1. No acute intracranial pathology seen on CT. 2.  Suggestion of mild small vessel ischemic microangiopathy. These results  were called by telephone at the time of interpretation on 06/26/2016 at 5:27 am to Dr. Roseanne RenoStewart, who verbally acknowledged these results. Electronically Signed   By: Roanna RaiderJeffery  Chang M.D.   On: 06/26/2016 05:27         Discharge Exam: Filed Vitals:   06/28/16 0919 06/28/16 1427  BP: 132/58 152/73  Pulse: 74 80  Temp: 98.4 F (36.9 C) 99.4 F (37.4 C)  Resp: 18 18   Filed Vitals:   06/28/16 0502 06/28/16 0546 06/28/16 0919 06/28/16 1427  BP: 193/97 143/67 132/58 152/73  Pulse: 77 79 74 80  Temp: 98.7 F (37.1 C)  98.4 F (36.9 C) 99.4 F (37.4 C)  TempSrc: Oral  Oral Oral  Resp: 18 16 18 18   Height:      Weight:      SpO2: 100% 100% 97% 100%    General: Pt is alert, awake, not in acute distress Cardiovascular: RRR, S1/S2 +, no rubs, no gallops Respiratory: CTA bilaterally, no wheezing, no rhonchi Abdominal: Soft, NT, ND, bowel sounds + Extremities: no edema, no cyanosis   The results of significant diagnostics from this hospitalization (including imaging, microbiology, ancillary and laboratory) are listed below for reference.    Significant Diagnostic Studies: Dg Chest 2 View  06/26/2016  CLINICAL DATA:  Sudden onset of left-sided weakness and numbness earlier today common no chest complaints EXAM: CHEST  2 VIEW COMPARISON:  None in PACs FINDINGS: The lungs are well-expanded and clear. The heart and pulmonary vascularity are normal. There is calcification in the wall of the aortic arch. There is no pleural effusion or pneumothorax. The bony thorax is unremarkable. IMPRESSION: 1. No active cardiopulmonary disease. 2. Aortic atherosclerosis. Electronically Signed   By: Oliviya Gilkison  [place] M.D.   On: 06/26/2016 07:39   Ct Angio Neck W Or Wo Contrast  06/28/2016  CLINICAL DATA:  LEFT-sided weakness, status post tPA. History of hypertension. EXAM: CT ANGIOGRAPHY NECK TECHNIQUE: Multidetector CT imaging of the neck was  performed using the standard protocol during bolus administration of intravenous contrast. Multiplanar CT image reconstructions and MIPs were obtained to evaluate the vascular anatomy. Carotid stenosis measurements (when applicable) are obtained utilizing NASCET criteria, using the distal internal carotid diameter as the denominator. CONTRAST:  50 cc Isovue 370 COMPARISON:  MRI and MRA head June 26, 2016 FINDINGS: Aortic arch: Normal appearance of the thoracic arch, normal branch pattern. Moderate calcific atherosclerosis of the aortic arch. The origins of the innominate, left Common carotid artery and subclavian artery are widely patent. Mild calcific atherosclerosis RIGHT subclavian artery with tandem mild stenoses. Right carotid system: Common carotid artery is widely patent, coursing in a straight line fashion. Eccentric calcific atherosclerosis resulting in less than 50% stenosis. Severe luminal irregularity of the cervical internal carotid artery without focal stenosis. Left carotid system: Common carotid artery is widely patent, coursing in a straight line fashion. Eccentric calcific atherosclerosis resulting in less than 50% stenosis. Severe luminal irregularity of the cervical internal carotid artery with moderate focal stenosis LEFT mid cervical internal carotid artery. Vertebral arteries:Calcific atherosclerosis resulting in severe stenosis RIGHT vertebral artery origin. Moderate generalized luminal regularity. Skeleton: No acute osseous process though bone windows have not been submitted. Moderate to severe C3-4 thru C6-7 degenerative discs. Other neck: Soft tissues of the neck are nonacute though, not tailored for evaluation. 11 mm LEFT thyroid nodule is below size followup recommendation. Additional less than 1 cm thyroid nodules and coarse calcifications. IMPRESSION: Atherosclerosis resulting in less than 50% stenosis bilateral  internal carotid artery origins. Moderate stenosis LEFT mid cervical  internal carotid artery. Severe luminal irregularity bilateral cervical internal carotid arteries and moderate luminal irregularity vertebral arteries likely reflecting atherosclerosis though there may be an underlying component of fibromuscular dysplasia. Severe stenosis RIGHT vertebral artery origin. Electronically Signed   By: Awilda Metro M.D.   On: 06/28/2016 06:14   Mr Brain Wo Contrast  06/26/2016  CLINICAL DATA:  70 year old female with left side numbness, tingling and weakness. Initial encounter. EXAM: MRI HEAD WITHOUT CONTRAST MRA HEAD WITHOUT CONTRAST TECHNIQUE: Multiplanar, multiecho pulse sequences of the brain and surrounding structures were obtained without intravenous contrast. Angiographic images of the head were obtained using MRA technique without contrast. COMPARISON:  Head CT without contrast 0522 hours today. FINDINGS: MRI HEAD FINDINGS There is a subtle 4-5 mm area of restricted diffusion suspected along the lateral aspect of the left thalamus (series 5, image 22). Mild underlying T2 heterogeneity in the bilateral thalami. There is a small chronic micro hemorrhage in the medial right thalamus. There is mild if any associated acute T2 and FLAIR hyperintensity. No associated hemorrhage or mass effect. No other restricted diffusion. Major intracranial vascular flow voids are preserved. No midline shift, mass effect, evidence of mass lesion, ventriculomegaly, extra-axial collection or acute intracranial hemorrhage. Cervicomedullary junction and pituitary are within normal limits. Punctate chronic micro hemorrhages also noted in the lentiform nuclei. No cortical encephalomalacia. Minimal to mild nonspecific cerebral white matter T2 and FLAIR heterogeneity. Negative brainstem and cerebellum. Visible internal auditory structures appear normal. Paranasal sinuses and mastoids are clear. Negative orbit soft tissues aside from postoperative changes to the globes. Negative scalp soft tissues.  Chronic disc and endplate degeneration in the cervical spine at C3-C4 appears to result in mild spinal stenosis. Normal bone marrow signal. MRA HEAD FINDINGS Antegrade flow in the posterior circulation, although the distal right vertebral artery is irregular throughout the V4 segment and appears severely stenosed just proximal to the vertebrobasilar junction (Series 805, image 14). There is mild to moderate irregularity in the distal left vertebral artery, but no significant stenosis. The basilar artery remains patent mild irregularity throughout. AICA, SCA, and PCA origins are patent. Posterior communicating arteries are diminutive or absent. There is moderate to severe irregularity and stenosis throughout the left PCA P2 segment (series 805, image 8) with preserved distal flow. There is focal moderate irregularity in the right P1/proximal P2 segment as seen on series 805, image 14. Preserved right PCA distal flow. Antegrade flow in both ICA siphons. Mild siphon irregularity with no stenosis. Ophthalmic artery origins appear normal. Carotid termini are patent. MCA 0 and right ACA origins are within normal limits. There is moderate to severe irregularity and stenosis of the left ACA A1 segment (series 804, image 10). The anterior communicating artery is present. There is widespread irregularity and stenosis throughout the bilateral ACA A2 segments which seem to remain patent. The left MCA is patent but with moderate M1 irregularity (series 804, image 10). Mild to moderate irregularity throughout the visualized left MCA branches. Right MCA is patent but with severe stenosis just proximal to the right MCA bifurcation. Mildly ectatic appearing bifurcation distal to the stenosis. Visible right MCA branches are patent with mild irregularity. IMPRESSION: 1. Evidence of a subtle acute to subacute lacunar infarct in the lateral right thalamus. No associated hemorrhage or mass effect. 2. Severe intracranial atherosclerosis.  Moderate irregularity of the proximal Right PCA might relate to the acute finding in #1. There is otherwise moderate or severe  stenosis at the: - Right vertebrobasilar junction, - Left PCA P2 segment, - Left ACA A1 segment and bilateral A2 segments, - Left MCA M1 segment, - Right MCA bifurcation. 3. Otherwise relatively little signal changes in the brain suggesting chronic ischemia despite the abnormal MRA appearance. There are several chronic micro hemorrhages in the deep gray matter nuclei. Electronically Signed   By: Odessa Fleming M.D.   On: 06/26/2016 09:28   Mr Maxine Glenn Head/brain Wo Cm  06/26/2016  CLINICAL DATA:  70 year old female with left side numbness, tingling and weakness. Initial encounter. EXAM: MRI HEAD WITHOUT CONTRAST MRA HEAD WITHOUT CONTRAST TECHNIQUE: Multiplanar, multiecho pulse sequences of the brain and surrounding structures were obtained without intravenous contrast. Angiographic images of the head were obtained using MRA technique without contrast. COMPARISON:  Head CT without contrast 0522 hours today. FINDINGS: MRI HEAD FINDINGS There is a subtle 4-5 mm area of restricted diffusion suspected along the lateral aspect of the left thalamus (series 5, image 22). Mild underlying T2 heterogeneity in the bilateral thalami. There is a small chronic micro hemorrhage in the medial right thalamus. There is mild if any associated acute T2 and FLAIR hyperintensity. No associated hemorrhage or mass effect. No other restricted diffusion. Major intracranial vascular flow voids are preserved. No midline shift, mass effect, evidence of mass lesion, ventriculomegaly, extra-axial collection or acute intracranial hemorrhage. Cervicomedullary junction and pituitary are within normal limits. Punctate chronic micro hemorrhages also noted in the lentiform nuclei. No cortical encephalomalacia. Minimal to mild nonspecific cerebral white matter T2 and FLAIR heterogeneity. Negative brainstem and cerebellum. Visible internal  auditory structures appear normal. Paranasal sinuses and mastoids are clear. Negative orbit soft tissues aside from postoperative changes to the globes. Negative scalp soft tissues. Chronic disc and endplate degeneration in the cervical spine at C3-C4 appears to result in mild spinal stenosis. Normal bone marrow signal. MRA HEAD FINDINGS Antegrade flow in the posterior circulation, although the distal right vertebral artery is irregular throughout the V4 segment and appears severely stenosed just proximal to the vertebrobasilar junction (Series 805, image 14). There is mild to moderate irregularity in the distal left vertebral artery, but no significant stenosis. The basilar artery remains patent mild irregularity throughout. AICA, SCA, and PCA origins are patent. Posterior communicating arteries are diminutive or absent. There is moderate to severe irregularity and stenosis throughout the left PCA P2 segment (series 805, image 8) with preserved distal flow. There is focal moderate irregularity in the right P1/proximal P2 segment as seen on series 805, image 14. Preserved right PCA distal flow. Antegrade flow in both ICA siphons. Mild siphon irregularity with no stenosis. Ophthalmic artery origins appear normal. Carotid termini are patent. MCA 0 and right ACA origins are within normal limits. There is moderate to severe irregularity and stenosis of the left ACA A1 segment (series 804, image 10). The anterior communicating artery is present. There is widespread irregularity and stenosis throughout the bilateral ACA A2 segments which seem to remain patent. The left MCA is patent but with moderate M1 irregularity (series 804, image 10). Mild to moderate irregularity throughout the visualized left MCA branches. Right MCA is patent but with severe stenosis just proximal to the right MCA bifurcation. Mildly ectatic appearing bifurcation distal to the stenosis. Visible right MCA branches are patent with mild irregularity.  IMPRESSION: 1. Evidence of a subtle acute to subacute lacunar infarct in the lateral right thalamus. No associated hemorrhage or mass effect. 2. Severe intracranial atherosclerosis. Moderate irregularity of the proximal Right PCA  might relate to the acute finding in #1. There is otherwise moderate or severe stenosis at the: - Right vertebrobasilar junction, - Left PCA P2 segment, - Left ACA A1 segment and bilateral A2 segments, - Left MCA M1 segment, - Right MCA bifurcation. 3. Otherwise relatively little signal changes in the brain suggesting chronic ischemia despite the abnormal MRA appearance. There are several chronic micro hemorrhages in the deep gray matter nuclei. Electronically Signed   By: Odessa Fleming M.D.   On: 06/26/2016 09:28   Ct Head Code Stroke W/o Cm  06/26/2016  CLINICAL DATA:  Code stroke. Acute onset of left-sided numbness. Initial encounter. EXAM: CT HEAD WITHOUT CONTRAST TECHNIQUE: Contiguous axial images were obtained from the base of the skull through the vertex without intravenous contrast. COMPARISON:  None. FINDINGS: There is no evidence of acute infarction, mass lesion, or intra- or extra-axial hemorrhage on CT. Mild periventricular white matter change may reflect small vessel ischemic microangiopathy. The posterior fossa, including the cerebellum, brainstem and fourth ventricle, is within normal limits. The third and lateral ventricles, and basal ganglia are unremarkable in appearance. The cerebral hemispheres are symmetric in appearance, with normal gray-white differentiation. No mass effect or midline shift is seen. There is no evidence of fracture; visualized osseous structures are unremarkable in appearance. The visualized portions of the orbits are within normal limits. The paranasal sinuses and mastoid air cells are well-aerated. No significant soft tissue abnormalities are seen. IMPRESSION: 1. No acute intracranial pathology seen on CT. 2. Suggestion of mild small vessel ischemic  microangiopathy. These results were called by telephone at the time of interpretation on 06/26/2016 at 5:27 am to Dr. Roseanne Reno, who verbally acknowledged these results. Electronically Signed   By: Roanna Raider M.D.   On: 06/26/2016 05:27     Microbiology: No results found for this or any previous visit (from the past 240 hour(s)).   Labs: Basic Metabolic Panel:  Recent Labs Lab 06/26/16 0515 06/26/16 0518 06/27/16 0722  NA 146* 144 140  K 3.2* 3.4* 3.8  CL 100* 106 106  CO2 27  --  27  GLUCOSE 112* 107* 90  BUN 16 18 14   CREATININE 1.08* 1.00 1.03*  CALCIUM 10.4*  --  9.5   Liver Function Tests:  Recent Labs Lab 06/26/16 0515  AST 24  ALT 19  ALKPHOS 68  BILITOT 0.7  PROT 7.4  ALBUMIN 3.5   No results for input(s): LIPASE, AMYLASE in the last 168 hours. No results for input(s): AMMONIA in the last 168 hours. CBC:  Recent Labs Lab 06/26/16 0515 06/26/16 0518  WBC 5.4  --   NEUTROABS 2.8  --   HGB 13.7 14.6  HCT 43.2 43.0  MCV 79.1  --   PLT 187  --    Cardiac Enzymes: No results for input(s): CKTOTAL, CKMB, CKMBINDEX, TROPONINI in the last 168 hours. BNP: Invalid input(s): POCBNP CBG:  Recent Labs Lab 06/26/16 0535 06/26/16 1625  GLUCAP 95 110*    Time coordinating discharge:  Greater than 30 minutes  Signed:  Aleksa Catterton, DO [facility] Pager: [phone] 06/28/2016, 3:41 PM

## 2016-06-28 NOTE — Progress Notes (Signed)
Pt BP was 193/97 at 0502 gave her Hydralazine 10 mg  at 0511 she went down for CTA and when she came back at 0546 BP was 143/67, will continue to monitor.

## 2016-06-28 NOTE — Progress Notes (Signed)
Pt discharged home with family. Discharge instructions given. IV removed. Pt questions asked and answered. Pt to transport home via family vehicle.

## 2016-06-30 LAB — VAS US CAROTID
LEFT ECA DIAS: -13 cm/s
LICADDIAS: -32 cm/s
LICADSYS: -102 cm/s
LICAPDIAS: -27 cm/s
LICAPSYS: -149 cm/s
Left CCA dist dias: -12 cm/s
Left CCA dist sys: -54 cm/s
Left CCA prox dias: 12 cm/s
Left CCA prox sys: 52 cm/s
RCCAPDIAS: 21 cm/s
RIGHT ECA DIAS: -12 cm/s
RIGHT VERTEBRAL DIAS: 18 cm/s
Right CCA prox sys: 91 cm/s
Right cca dist sys: -154 cm/s

## 2016-07-11 DIAGNOSIS — Z9181 History of falling: Secondary | ICD-10-CM | POA: Insufficient documentation

## 2016-07-11 DIAGNOSIS — Z8673 Personal history of transient ischemic attack (TIA), and cerebral infarction without residual deficits: Secondary | ICD-10-CM | POA: Insufficient documentation

## 2016-07-12 DIAGNOSIS — R011 Cardiac murmur, unspecified: Secondary | ICD-10-CM | POA: Insufficient documentation

## 2016-07-12 DIAGNOSIS — R7303 Prediabetes: Secondary | ICD-10-CM | POA: Insufficient documentation

## 2016-07-25 ENCOUNTER — Other Ambulatory Visit: Payer: Self-pay | Admitting: Family

## 2016-07-25 DIAGNOSIS — Z1231 Encounter for screening mammogram for malignant neoplasm of breast: Secondary | ICD-10-CM

## 2016-07-25 DIAGNOSIS — R52 Pain, unspecified: Secondary | ICD-10-CM

## 2016-07-26 ENCOUNTER — Ambulatory Visit
Admission: RE | Admit: 2016-07-26 | Discharge: 2016-07-26 | Disposition: A | Discharge status: Home / Self Care | Payer: Medicare Other | Source: Ambulatory Visit | Attending: Family | Admitting: Family

## 2016-07-26 DIAGNOSIS — R52 Pain, unspecified: Secondary | ICD-10-CM

## 2016-08-01 ENCOUNTER — Ambulatory Visit
Admission: RE | Admit: 2016-08-01 | Discharge: 2016-08-01 | Disposition: A | Discharge status: Home / Self Care | Payer: Medicare Other | Source: Ambulatory Visit | Attending: Family | Admitting: Family

## 2016-08-01 DIAGNOSIS — Z1231 Encounter for screening mammogram for malignant neoplasm of breast: Secondary | ICD-10-CM

## 2016-08-03 ENCOUNTER — Other Ambulatory Visit: Payer: Self-pay | Admitting: Family

## 2016-08-03 DIAGNOSIS — R928 Other abnormal and inconclusive findings on diagnostic imaging of breast: Secondary | ICD-10-CM

## 2016-08-09 ENCOUNTER — Ambulatory Visit
Admission: RE | Admit: 2016-08-09 | Discharge: 2016-08-09 | Disposition: A | Discharge status: Home / Self Care | Payer: Medicare Other | Source: Ambulatory Visit | Attending: Family | Admitting: Family

## 2016-08-09 DIAGNOSIS — R928 Other abnormal and inconclusive findings on diagnostic imaging of breast: Secondary | ICD-10-CM

## 2016-08-29 ENCOUNTER — Encounter: Payer: Self-pay | Admitting: Nurse Practitioner

## 2016-08-29 ENCOUNTER — Ambulatory Visit (INDEPENDENT_AMBULATORY_CARE_PROVIDER_SITE_OTHER): Payer: Medicare Other | Admitting: Nurse Practitioner

## 2016-08-29 VITALS — BP 133/77 | HR 77 | Ht 67.0 in | Wt 185.2 lb

## 2016-08-29 DIAGNOSIS — I10 Essential (primary) hypertension: Secondary | ICD-10-CM

## 2016-08-29 DIAGNOSIS — E785 Hyperlipidemia, unspecified: Secondary | ICD-10-CM

## 2016-08-29 DIAGNOSIS — I639 Cerebral infarction, unspecified: Secondary | ICD-10-CM

## 2016-08-29 DIAGNOSIS — I63331 Cerebral infarction due to thrombosis of right posterior cerebral artery: Secondary | ICD-10-CM | POA: Diagnosis not present

## 2016-08-29 NOTE — Progress Notes (Signed)
[facility]  PATIENT: Allison Schneider DOB: 1946-06-23   REASON FOR VISIT: Hospital follow-up for stroke HISTORY FROM: Patient    HISTORY OF PRESENT ILLNESS:Allison Schneider is an 70 y.o. female with a history of hypertension currently not on medication, presenting with new onset left-sided numbness and slurred speech on 06/26/16. . Transient left facial droop was noted by EMS. Patient has no previous history of stroke nor TIA. She has not been on antiplatelet therapy. CT scan of her head showed no acute intracranial abnormality. She did not experience weakness involving left extremities. Speech has returned to normal. NIH stroke score was 2. Blood pressure was elevated at 232/109. She was given labetalol 20 mg IV. She was LKW at 3:30 AM on 06/26/2016. Patient was not administered IV t-PA secondary to minimal deficits. She was admitted for further evaluation and treatment MRI of the brain R lateral thalamic lacunar infarct secondary to small vessel disease source  MRA  Severe diffuse intracranial stenosis large vessels. 2-D echo EF 65-70% CUS 40-59% RIGHT ICA STENOSIS .[name] LDL 189 Plavix was added to her aspirin regimen. She was placed on Lipitor 80 mg daily. She returns today for follow-up without further stroke or TIA symptoms.   REVIEW OF SYSTEMS: Full 14 system review of systems performed and notable only for those listed, all others are neg:  Constitutional: Fatigue  Cardiovascular: neg Ear/Nose/Throat: neg  Skin: neg Eyes: neg Respiratory: neg Gastroitestinal: Constipation Hematology/Lymphatic: neg  Endocrine: neg Musculoskeletal: Joint pain Allergy/Immunology: neg Neurological: neg Psychiatric: neg Sleep : neg   ALLERGIES: No Known Allergies  HOME MEDICATIONS: Outpatient Medications Prior to Visit  Medication Sig Dispense Refill  . aspirin 325 MG tablet Take 1 tablet (325 mg total) by mouth daily. X 90 days 30 tablet 0  . atorvastatin (LIPITOR) 80 MG tablet Take 1  tablet (80 mg total) by mouth daily at 6 PM. 30 tablet 1  . clopidogrel (PLAVIX) 75 MG tablet Take 1 tablet (75 mg total) by mouth daily. 30 tablet 1  . hydrochlorothiazide (MICROZIDE) 12.5 MG capsule Take 1 capsule (12.5 mg total) by mouth daily. 30 capsule 1  . lisinopril (PRINIVIL,ZESTRIL) 10 MG tablet Take 1 tablet (10 mg total) by mouth daily. (Patient not taking: Reported on 08/29/2016) 30 tablet 1   No facility-administered medications prior to visit.     PAST MEDICAL HISTORY: Past Medical History:  Diagnosis Date  . Hypertension   . Obesity     PAST SURGICAL HISTORY: History reviewed. No pertinent surgical history.  FAMILY HISTORY: Family History  Problem Relation Age of Onset  . CAD Mother     MI    SOCIAL HISTORY: Social History   Social History  . Marital status: Single    Spouse name: N/A  . Number of children: N/A  . Years of education: N/A   Occupational History  . Not on file.   Social History Main Topics  . Smoking status: Never Smoker  . Smokeless tobacco: Never Used  . Alcohol use Not on file  . Drug use: Unknown  . Sexual activity: Not on file   Other Topics Concern  . Not on file   Social History Narrative   Live alone.   Caffeine avg 1-2 weekly   Right handed.     One son living.  (one passed).      PHYSICAL EXAM  Vitals:   08/29/16 1535  BP: 133/77  Pulse: 77  Weight: 185 lb 3.2 oz (84 kg)  Height:  5\' 7"  (1.702 m)   Body mass index is 29.01 kg/m.  Generalized: Well developed, in no acute distress  Head: normocephalic and atraumatic,. Oropharynx benign  Neck: Supple, no carotid bruits  Cardiac: Regular rate rhythm, no murmur  Musculoskeletal: No deformity   Neurological examination   Mentation: Alert oriented to time, place, history taking. Attention span and concentration appropriate. Recent and remote memory intact.  Follows all commands speech and language fluent.   Cranial nerve II-XII: Fundoscopic exam reveals sharp  disc margins.Pupils were equal round reactive to light extraocular movements were full, visual field were full on confrontational test. Facial sensation and strength were normal. hearing was intact to finger rubbing bilaterally. Uvula tongue midline. head turning and shoulder shrug were normal and symmetric.Tongue protrusion into cheek strength was normal. Motor: normal bulk and tone, full strength in the BUE, BLE, fine finger movements normal, no pronator drift. No focal weakness Sensory: normal and symmetric to light touch, pinprick, and  Vibration, proprioception  Coordination: finger-nose-finger, heel-to-shin bilaterally, no dysmetria Reflexes: 1+ upper lower and symmetric plantar responses were flexor bilaterally. Gait and Station: Rising up from seated position without assistance, normal stance,  moderate stride, good arm swing, smooth turning, able to perform tiptoe, and heel walking without difficulty. Tandem gait is steady  DIAGNOSTIC DATA (LABS, IMAGING, TESTING) - I reviewed patient records, labs, notes, testing and imaging myself where available.  Lab Results  Component Value Date   WBC 5.4 06/26/2016   HGB 14.6 06/26/2016   HCT 43.0 06/26/2016   MCV 79.1 06/26/2016   PLT 187 06/26/2016      Component Value Date/Time   NA 140 06/27/2016 0722   K 3.8 06/27/2016 0722   CL 106 06/27/2016 0722   CO2 27 06/27/2016 0722   GLUCOSE 90 06/27/2016 0722   BUN 14 06/27/2016 0722   CREATININE 1.03 (H) 06/27/2016 0722   CALCIUM 9.5 06/27/2016 0722   PROT 7.4 06/26/2016 0515   ALBUMIN 3.5 06/26/2016 0515   AST 24 06/26/2016 0515   ALT 19 06/26/2016 0515   ALKPHOS 68 06/26/2016 0515   BILITOT 0.7 06/26/2016 0515   GFRNONAA 53 (L) 06/27/2016 0722   GFRAA >60 06/27/2016 0722   Lab Results  Component Value Date   CHOL 255 (H) 06/26/2016   HDL 56 06/26/2016   LDLCALC 189 (H) 06/26/2016   TRIG 49 06/26/2016   CHOLHDL 4.6 06/26/2016   Lab Results  Component Value Date   HGBA1C  5.9 (H) 06/26/2016    ASSESSMENT AND PLAN  70 y.o. year old female  has a past medical history of Hypertension and Obesity. here To follow-up for right lateral thalamic infarct. MRA  Severe diffuse intracranial stenosis large vessels. 2-D echo EF 65-70% CUS 40-59% RIGHT ICA STENOSIS. LDL 189    PLAN: Stressed the importance of management of risk factors to prevent further stroke Continue Plavix and ASA for secondary stroke prevention for 1 more month then Plavix only Maintain strict control of hypertension with blood pressure goal below 130/90, today's reading 133/77continue antihypertensive medications Control of diabetes with hemoglobin A1c below 6.5  Cholesterol with LDL cholesterol less than 70, followed by primary care,  most recent189 continue statin drug Lipitor Exercise by walking, slowly increase , eat healthy diet with whole grains,  fresh fruits and vegetables Follow up in 4 months Discussed risk for recurrent stroke/ TIA and answered additional questions This was a prolonged visit requiring 30 minutes and medical decision making of high complexity with extensive review  of history, hospital chart, counseling and answering questions Nancy Carolyn Martin, [facility]NP, [facility], APRN  [facility] [address], [address] [place], [place] [address] [phone]

## 2016-08-29 NOTE — Patient Instructions (Addendum)
Stressed the importance of management of risk factors to prevent further stroke Continue Plavix and ASA for secondary stroke prevention for 1 more month then Plavix only Maintain strict control of hypertension with blood pressure goal below 130/90, today's reading 133/77continue antihypertensive medications Control of diabetes with hemoglobin A1c below 6.5  Cholesterol with LDL cholesterol less than 70, followed by primary care,  most recent189 continue statin drugs Exercise by walking, slowly increase , eat healthy diet with whole grains,  fresh fruits and vegetables Follow up in 4 months

## 2016-08-30 NOTE — Progress Notes (Signed)
I reviewed above note and agree with the assessment and plan.  Marvel PlanJindong Kailey Esquilin, MD PhD Stroke Neurology 08/30/2016 6:14 AM

## 2016-09-08 ENCOUNTER — Other Ambulatory Visit: Payer: Self-pay | Admitting: Family

## 2016-09-08 DIAGNOSIS — E2839 Other primary ovarian failure: Secondary | ICD-10-CM

## 2016-10-24 ENCOUNTER — Ambulatory Visit
Admission: RE | Admit: 2016-10-24 | Discharge: 2016-10-24 | Disposition: A | Discharge status: Home / Self Care | Payer: Medicare Other | Source: Ambulatory Visit | Attending: Family | Admitting: Family

## 2016-10-24 DIAGNOSIS — E2839 Other primary ovarian failure: Secondary | ICD-10-CM

## 2016-11-01 DIAGNOSIS — M8580 Other specified disorders of bone density and structure, unspecified site: Secondary | ICD-10-CM | POA: Insufficient documentation

## 2016-12-27 DIAGNOSIS — M8580 Other specified disorders of bone density and structure, unspecified site: Secondary | ICD-10-CM | POA: Diagnosis not present

## 2016-12-27 DIAGNOSIS — R7303 Prediabetes: Secondary | ICD-10-CM | POA: Diagnosis not present

## 2016-12-27 DIAGNOSIS — I10 Essential (primary) hypertension: Secondary | ICD-10-CM | POA: Diagnosis not present

## 2016-12-27 DIAGNOSIS — E782 Mixed hyperlipidemia: Secondary | ICD-10-CM | POA: Diagnosis not present

## 2016-12-28 ENCOUNTER — Ambulatory Visit (INDEPENDENT_AMBULATORY_CARE_PROVIDER_SITE_OTHER): Payer: Medicare Other | Admitting: Nurse Practitioner

## 2016-12-28 ENCOUNTER — Encounter: Payer: Self-pay | Admitting: Nurse Practitioner

## 2016-12-28 VITALS — BP 132/72 | HR 66 | Ht 67.0 in | Wt 175.6 lb

## 2016-12-28 DIAGNOSIS — E785 Hyperlipidemia, unspecified: Secondary | ICD-10-CM | POA: Diagnosis not present

## 2016-12-28 DIAGNOSIS — I639 Cerebral infarction, unspecified: Secondary | ICD-10-CM

## 2016-12-28 DIAGNOSIS — I10 Essential (primary) hypertension: Secondary | ICD-10-CM | POA: Diagnosis not present

## 2016-12-28 NOTE — Progress Notes (Signed)
I have reviewed and agreed above plan. 

## 2016-12-28 NOTE — Progress Notes (Signed)
[facility]  PATIENT: Allison Schneider DOB: 10/20/1946   REASON FOR VISIT:  follow-up for stroke 06/26/16  HISTORY FROM: Patient    HISTORY OF PRESENT ILLNESS:UPDATE  01/04/2018CM: AllisonSchneider,  71 year old female returns for followup for stroke with risk factors of HTN, and hyperlipidemia and severe diffuse intracranial stenosis large vessels . She is currently on Plavix with minimal bruising and no bleeding. She has not had recurrent stroke or TIA symptoms. She denies myalgias with her Lipitor. Blood pressure well controlled in the office today 132/72. She walks for exercise. She returns for reevaluation   HISTORY: 08/29/16 CMNan Olegario Schneider is an 71 y.o. female with a history of hypertension currently not on medication, presenting with new onset left-sided numbness and slurred speech on 06/26/16. . Transient left facial droop was noted by EMS. Patient has no previous history of stroke nor TIA. She has not been on antiplatelet therapy. CT scan of her head showed no acute intracranial abnormality. She did not experience weakness involving left extremities. Speech has returned to normal. NIH stroke score was 2. Blood pressure was elevated at 232/109. She was given labetalol 20 mg IV. She was LKW at 3:30 AM on 06/26/2016. Patient was not administered IV t-PA secondary to minimal deficits. She was admitted for further evaluation and treatment MRI of the brain R lateral thalamic lacunar infarct secondary to small vessel disease source  MRA  Severe diffuse intracranial stenosis large vessels. 2-D echo EF 65-70% CUS 40-59% RIGHT ICA STENOSIS .Allison Schneider. LDL 189 Plavix was added to her aspirin regimen. She was placed on Lipitor 80 mg daily. She returns today for follow-up without further stroke or TIA symptoms.   REVIEW OF SYSTEMS: Full 14 system review of systems performed and notable only for those listed, all others are neg:  Constitutional: Fatigue  Cardiovascular: neg Ear/Nose/Throat: neg  Skin:  neg Eyes: neg Respiratory: neg Gastroitestinal: Constipation Hematology/Lymphatic: neg  Endocrine: neg Musculoskeletal: Joint pain Allergy/Immunology: neg Neurological: neg Psychiatric: neg Sleep : Restless leg   ALLERGIES: No Known Allergies  HOME MEDICATIONS: Outpatient Medications Prior to Visit  Medication Sig Dispense Refill  . amLODipine (NORVASC) 5 MG tablet Take 5 mg by mouth at bedtime.  1  . atorvastatin (LIPITOR) 80 MG tablet Take 1 tablet (80 mg total) by mouth daily at 6 PM. 30 tablet 1  . clopidogrel (PLAVIX) 75 MG tablet Take 1 tablet (75 mg total) by mouth daily. 30 tablet 1  . hydrochlorothiazide (MICROZIDE) 12.5 MG capsule Take 1 capsule (12.5 mg total) by mouth daily. 30 capsule 1  . lisinopril (PRINIVIL,ZESTRIL) 20 MG tablet Take 40 mg by mouth daily.     Allison Schneider. aspirin 325 MG tablet Take 1 tablet (325 mg total) by mouth daily. X 90 days 30 tablet 0   No facility-administered medications prior to visit.     PAST MEDICAL HISTORY: Past Medical History:  Diagnosis Date  . Hypertension   . Obesity     PAST SURGICAL HISTORY: History reviewed. No pertinent surgical history.  FAMILY HISTORY: Family History  Problem Relation Age of Onset  . CAD Mother     MI    SOCIAL HISTORY: Social History   Social History  . Marital status: Single    Spouse name: N/A  . Number of children: N/A  . Years of education: N/A   Occupational History  . Not on file.   Social History Main Topics  . Smoking status: Never Smoker  . Smokeless tobacco: Never Used  .  Alcohol use Not on file  . Drug use: Unknown  . Sexual activity: Not on file   Other Topics Concern  . Not on file   Social History Narrative   Live alone.   Caffeine avg 1-2 weekly   Right handed.     One son living.  (one passed).      PHYSICAL EXAM  Vitals:   12/28/16 0949  BP: 132/72  Pulse: 66  Weight: 175 lb 9.6 oz (79.7 kg)  Height: 5\' 7"  (1.702 m)   Body mass index is 27.5  kg/m.  Generalized: Well developed, in no acute distress  Head: normocephalic and atraumatic,. Oropharynx benign  Neck: Supple, no carotid bruits  Cardiac: Regular rate rhythm, no murmur  Musculoskeletal: No deformity   Neurological examination   Mentation: Alert oriented to time, place, history taking. Attention span and concentration appropriate. Recent and remote memory intact.  Follows all commands speech and language fluent.   Cranial nerve II-XII: Fundoscopic exam not done .Pupils were equal round reactive to light extraocular movements were full, visual field were full on confrontational test. Facial sensation and strength were normal. hearing was intact to finger rubbing bilaterally. Uvula tongue midline. head turning and shoulder shrug were normal and symmetric.Tongue protrusion into cheek strength was normal. Motor: normal bulk and tone, full strength in the BUE, BLE, fine finger movements normal, no pronator drift. No focal weakness Sensory: normal and symmetric to light touch, pinprick, and  Vibration, in the upper and lower extremities  Coordination: finger-nose-finger, heel-to-shin bilaterally, no dysmetria Reflexes: 1+ upper lower and symmetric plantar responses were flexor bilaterally. Gait and Station: Rising up from seated position without assistance, normal stance,  moderate stride, good arm swing, smooth turning, able to perform tiptoe, and heel walking without difficulty. Tandem gait is steady. No assistive device  DIAGNOSTIC DATA (LABS, IMAGING, TESTING) - I reviewed patient records, labs, notes, testing and imaging myself where available.  Lab Results  Component Value Date   WBC 5.4 06/26/2016   HGB 14.6 06/26/2016   HCT 43.0 06/26/2016   MCV 79.1 06/26/2016   PLT 187 06/26/2016      Component Value Date/Time   NA 140 06/27/2016 0722   K 3.8 06/27/2016 0722   CL 106 06/27/2016 0722   CO2 27 06/27/2016 0722   GLUCOSE 90 06/27/2016 0722   BUN 14 06/27/2016  0722   CREATININE 1.03 (H) 06/27/2016 0722   CALCIUM 9.5 06/27/2016 0722   PROT 7.4 06/26/2016 0515   ALBUMIN 3.5 06/26/2016 0515   AST 24 06/26/2016 0515   ALT 19 06/26/2016 0515   ALKPHOS 68 06/26/2016 0515   BILITOT 0.7 06/26/2016 0515   GFRNONAA 53 (L) 06/27/2016 0722   GFRAA >60 06/27/2016 0722   Lab Results  Component Value Date   CHOL 255 (H) 06/26/2016   HDL 56 06/26/2016   LDLCALC 189 (H) 06/26/2016   TRIG 49 06/26/2016   CHOLHDL 4.6 06/26/2016   Lab Results  Component Value Date   HGBA1C 5.9 (H) 06/26/2016    ASSESSMENT AND PLAN  71 y.o. year old female  has a past medical history of Hypertension and Obesity. here To follow-up for right lateral thalamic infarct. MRA  Severe diffuse intracranial stenosis large vessels. 2-D echo EF 65-70% CUS 40-59% RIGHT ICA STENOSIS. LDL 189 . The patient is a current patient of Dr. Roda Shutters  who is out of the office today . This note is sent to the work in doctor.  PLAN: Continue Plavix for secondary stroke prevention  Maintain strict control of hypertension with blood pressure goal below 130/90, today's reading 132/72 continue antihypertensive medications hydrochlorothiazide ,  NORVASC AND LISINOPRIL Cholesterol with LDL cholesterol less than 70, followed by primary care,  CONTINUE LIPITOR Exercise by walking, , eat healthy diet with whole grains,  fresh fruits and vegetables Follow up in 6 months if stable will discharge at that time Nilda Riggs, [facility] [place], [facility], APRN  [facility] [address], [address] [place], [place] [address] [phone]

## 2016-12-28 NOTE — Patient Instructions (Signed)
Continue Plavix for secondary stroke prevention  Maintain strict control of hypertension with blood pressure goal below 130/90, today's reading 132/72 continue antihypertensive medications hydrochlorothiazide ,  NORVASC AND LISINOPRIL Cholesterol with LDL cholesterol less than 70, followed by primary care,  CONTINUE LIPITOR Exercise by walking, slowly increase , eat healthy diet with whole grains,  fresh fruits and vegetables Follow up in 6 months if stable will discharge at that time

## 2017-01-25 IMAGING — US US EXTREM LOW VENOUS*L*
1 series · 13 of 24 positions shown · non-contrast
Comparison: None.

CLINICAL DATA: Acute left calf pain for 2 days.



[Series 1: us extrem low venous*left* · 13 of 34 slices shown]
[im 1/34]
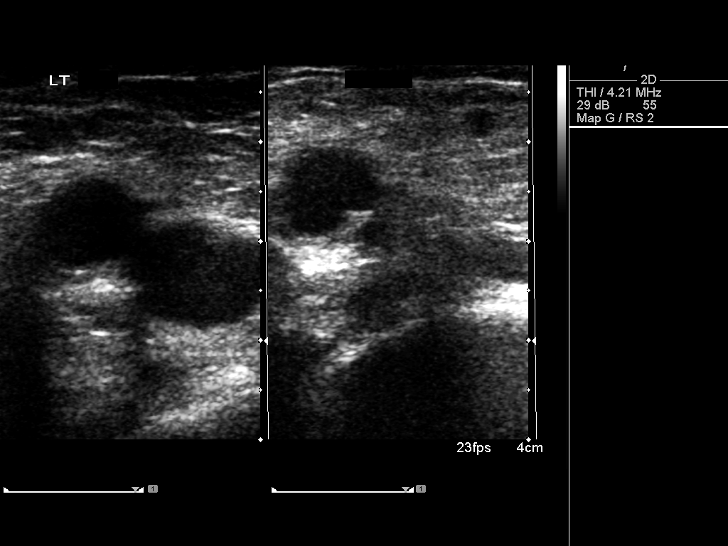
[im 3/34]
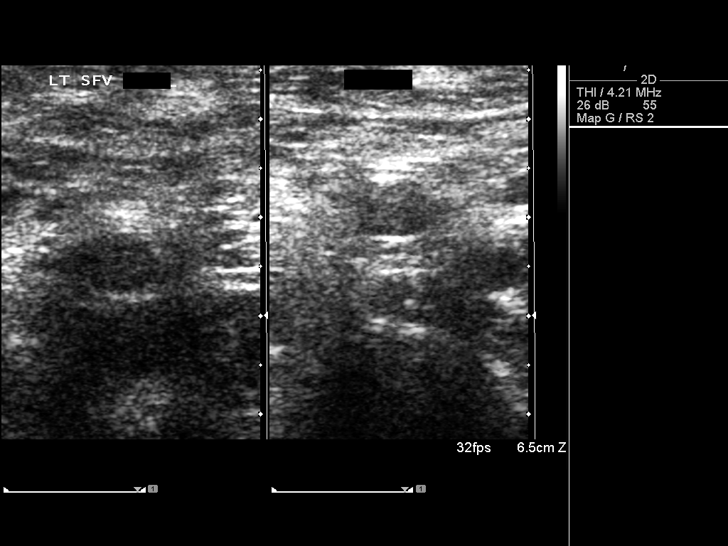
[im 6/34]
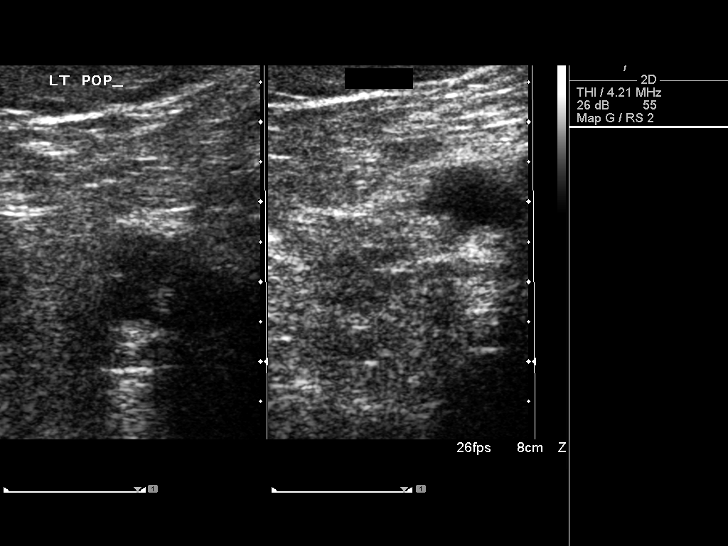
[im 9/34]
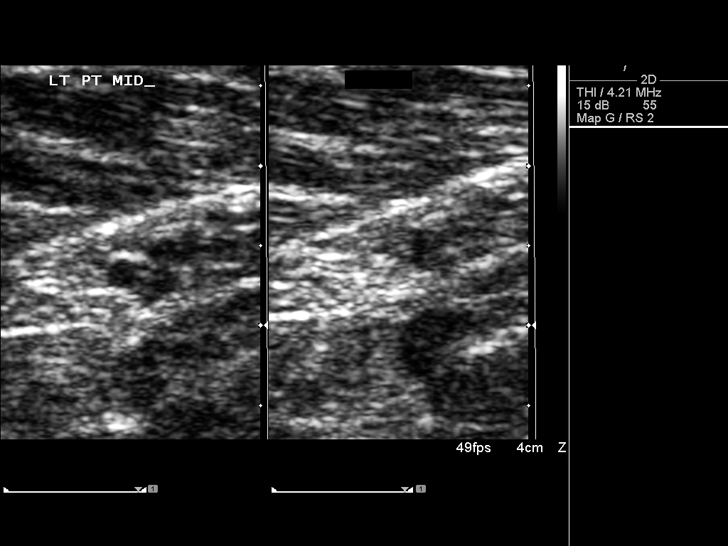
[im 12/34]
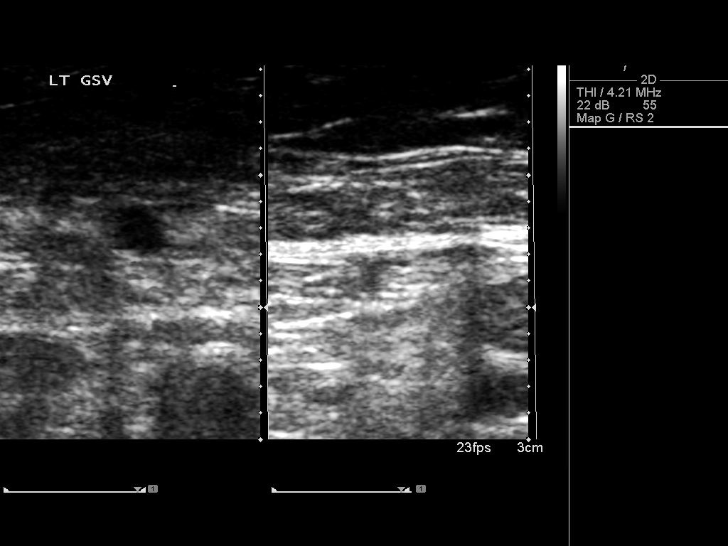
[im 15/34]
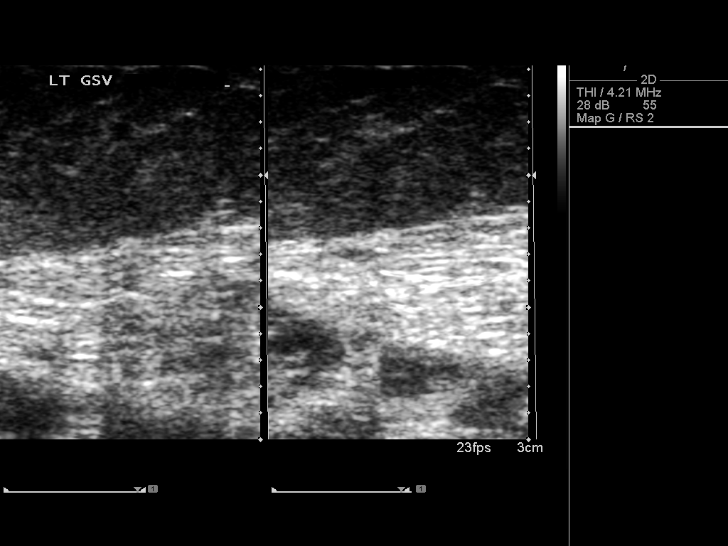
[im 18/34]
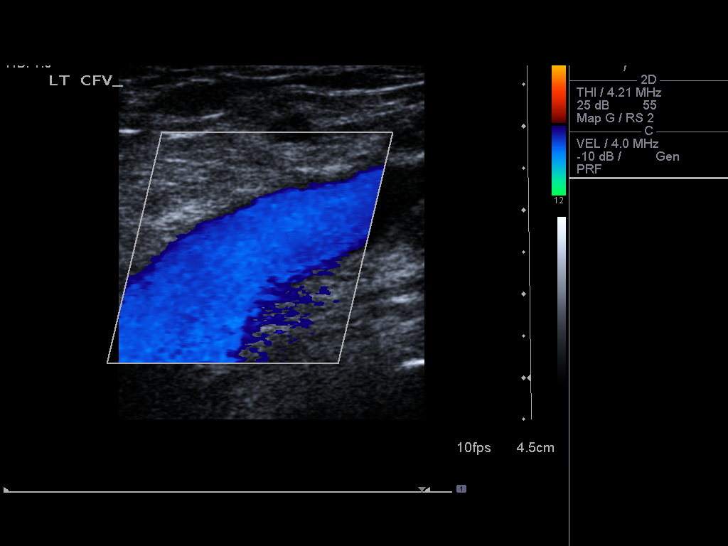
[im 19/34]
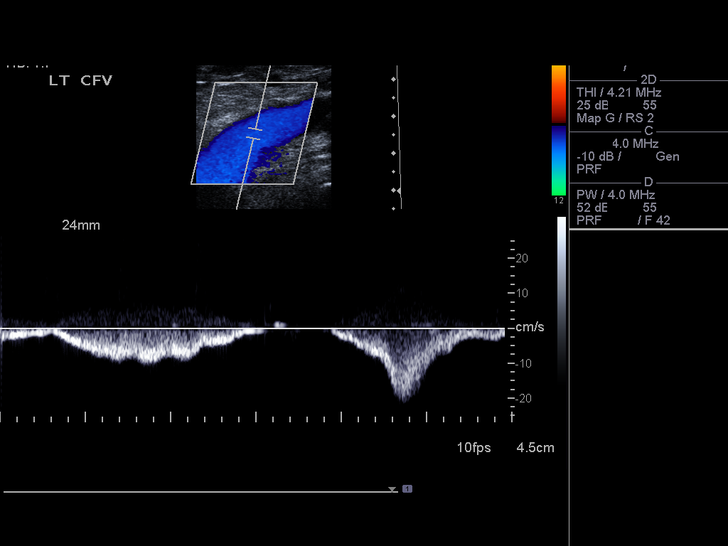
[im 22/34]
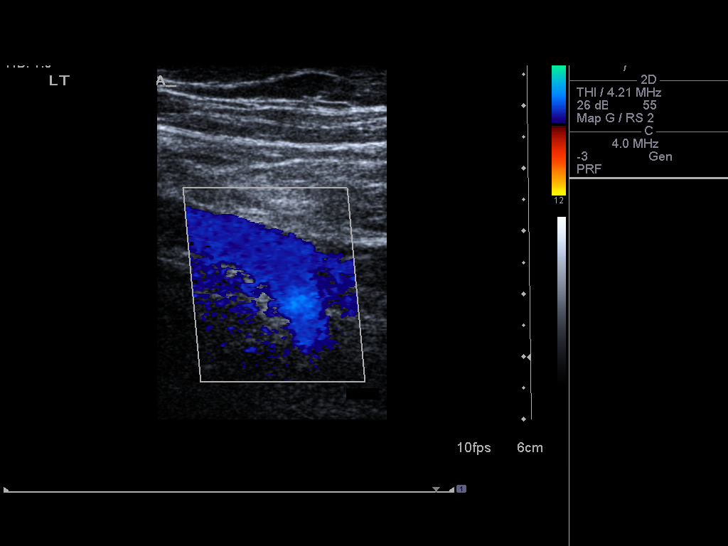
[im 25/34]
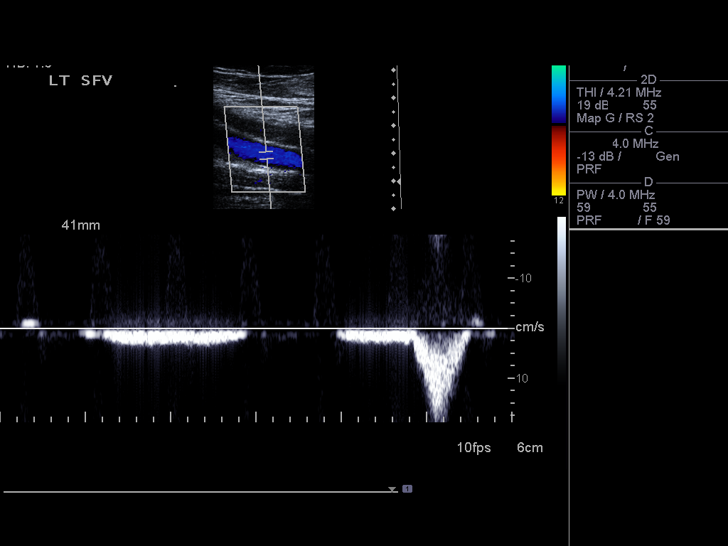
[im 28/34]
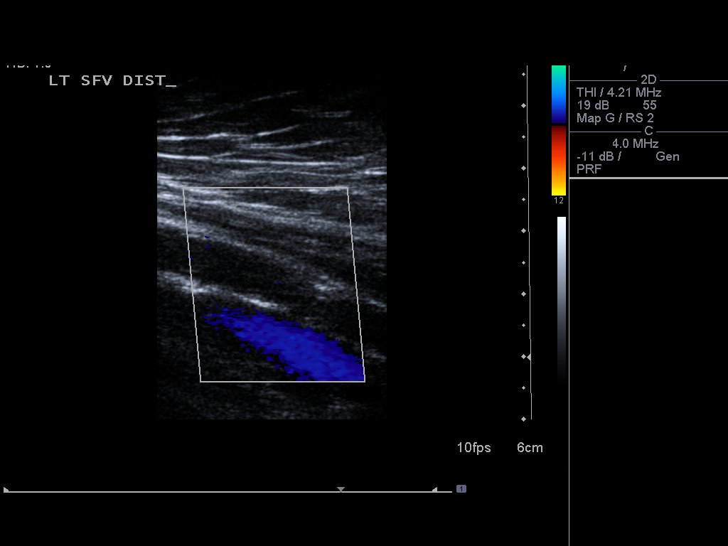
[im 31/34]
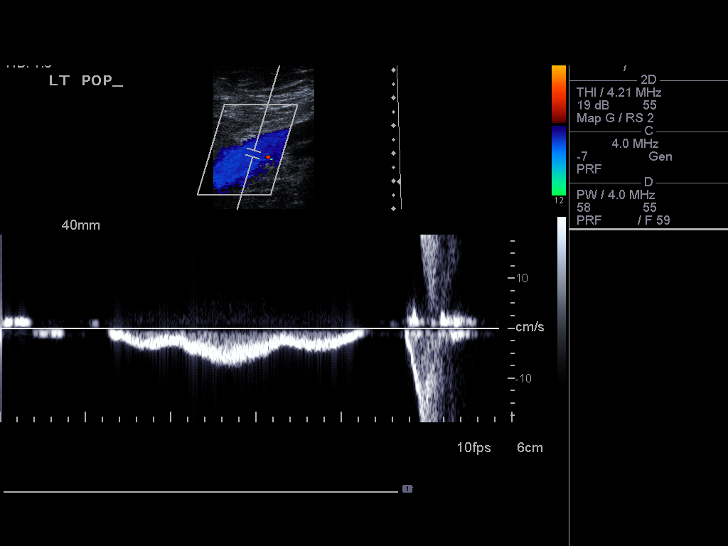
[im 34/34]
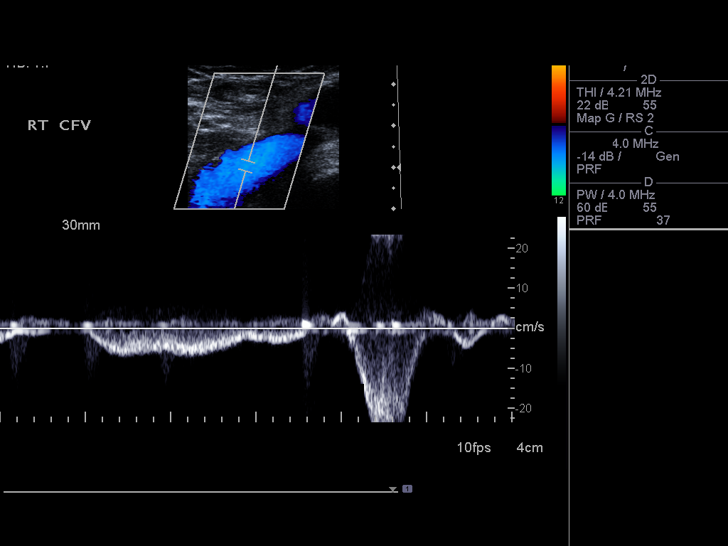

[13 of 24 positions shown; findings below may reference images not displayed]

FINDINGS: Contralateral Common Femoral Vein: Respiratory phasicity is normal
and symmetric with the symptomatic side. No evidence of thrombus.
Normal compressibility.

Common Femoral Vein: No evidence of thrombus. Normal
compressibility, respiratory phasicity and response to augmentation.

Saphenofemoral Junction: No evidence of thrombus. Normal
compressibility and flow on color Doppler imaging.

Profunda Femoral Vein: No evidence of thrombus. Normal
compressibility and flow on color Doppler imaging.

Femoral Vein: No evidence of thrombus. Normal compressibility,
respiratory phasicity and response to augmentation.

Popliteal Vein: No evidence of thrombus. Normal compressibility,
respiratory phasicity and response to augmentation.

Calf Veins: No evidence of thrombus. Normal compressibility and flow
on color Doppler imaging.

Superficial Great Saphenous Vein: No evidence of thrombus. Normal
compressibility and flow on color Doppler imaging.

Venous Reflux:  None.

Other Findings:  None.
IMPRESSION: No evidence of deep venous thrombosis.

## 2017-02-01 DIAGNOSIS — E782 Mixed hyperlipidemia: Secondary | ICD-10-CM | POA: Diagnosis not present

## 2017-02-01 DIAGNOSIS — I10 Essential (primary) hypertension: Secondary | ICD-10-CM | POA: Diagnosis not present

## 2017-03-21 DIAGNOSIS — H40023 Open angle with borderline findings, high risk, bilateral: Secondary | ICD-10-CM | POA: Diagnosis not present

## 2017-07-11 DIAGNOSIS — I10 Essential (primary) hypertension: Secondary | ICD-10-CM | POA: Diagnosis not present

## 2017-07-11 DIAGNOSIS — E782 Mixed hyperlipidemia: Secondary | ICD-10-CM | POA: Diagnosis not present

## 2017-07-11 DIAGNOSIS — Z23 Encounter for immunization: Secondary | ICD-10-CM | POA: Diagnosis not present

## 2017-07-23 ENCOUNTER — Encounter: Payer: Self-pay | Admitting: Nurse Practitioner

## 2017-07-23 ENCOUNTER — Ambulatory Visit (INDEPENDENT_AMBULATORY_CARE_PROVIDER_SITE_OTHER): Payer: Medicare Other | Admitting: Nurse Practitioner

## 2017-07-23 VITALS — BP 154/82 | HR 67 | Wt 163.4 lb

## 2017-07-23 DIAGNOSIS — I639 Cerebral infarction, unspecified: Secondary | ICD-10-CM

## 2017-07-23 DIAGNOSIS — E785 Hyperlipidemia, unspecified: Secondary | ICD-10-CM

## 2017-07-23 DIAGNOSIS — I10 Essential (primary) hypertension: Secondary | ICD-10-CM | POA: Diagnosis not present

## 2017-07-23 NOTE — Progress Notes (Signed)
I agree with the assessment and plan as directed by NP .The patient is known to me .   Madolin Twaddle, MD  

## 2017-07-23 NOTE — Progress Notes (Signed)
[facility]  PATIENT: Allison Schneider DOB: 06/27/1946   REASON FOR VISIT:  follow-up for stroke 06/26/16  HISTORY FROM: Patient    HISTORY OF PRESENT ILLNESS:UPDATE 07/30/2018CM Allison Schneider, 71 year old female returns for follow-up with history of stroke event in July 2017. She has risk factors of hyperlipidemia hypertension and intracranial stenosis. She is currently on Plavix for secondary stroke prevention without recurrent stroke or TIA symptoms. She has minimal bruising and no bleeding. She reports a rare numbness in her left arm that just lasts moments. She remains on Lipitor for hyperlipidemia. Most recent LDL 112 07/11/2017. She denies myalgias Blood pressure elevated in the office today at 154/82. She claims she has been out of town for a convention did not watch her diet or get any exercise. In fact she says she has been off her exercise routine for several months now and was encouraged to resume this for stroke prevention. She returns for reevaluation   UPDATE  01/04/2018CM: [name],  71 year old female returns for followup for stroke with risk factors of HTN, and hyperlipidemia and severe diffuse intracranial stenosis large vessels . She is currently on Plavix with minimal bruising and no bleeding. She has not had recurrent stroke or TIA symptoms. She denies myalgias with her Lipitor. Blood pressure well controlled in the office today 132/72. She walks for exercise. She returns for reevaluation   HISTORY: 08/29/16 CMNan Olegario Schneider is an 71 y.o. female with a history of hypertension currently not on medication, presenting with new onset left-sided numbness and slurred speech on 06/26/16. . Transient left facial droop was noted by EMS. Patient has no previous history of stroke nor TIA. She has not been on antiplatelet therapy. CT scan of her head showed no acute intracranial abnormality. She did not experience weakness involving left extremities. Speech has returned to normal.  NIH stroke score was 2. Blood pressure was elevated at 232/109. She was given labetalol 20 mg IV. She was LKW at 3:30 AM on 06/26/2016. Patient was not administered IV t-PA secondary to minimal deficits. She was admitted for further evaluation and treatment MRI of the brain R lateral thalamic lacunar infarct secondary to small vessel disease source  MRA  Severe diffuse intracranial stenosis large vessels. 2-D echo EF 65-70% CUS 40-59% RIGHT ICA STENOSIS .[name]. LDL 189 Plavix was added to her aspirin regimen. She was placed on Lipitor 80 mg daily. She returns today for follow-up without further stroke or TIA symptoms.   REVIEW OF SYSTEMS: Full 14 system review of systems performed and notable only for those listed, all others are neg:  Constitutional: neg  Cardiovascular: neg Ear/Nose/Throat: neg  Skin: neg Eyes: neg Respiratory: neg Gastroitestinal: Constipation Hematology/Lymphatic: neg  Endocrine: Intolerance to cold Musculoskeletal: neg Allergy/Immunology: neg Neurological: Rare numbness in the hand Psychiatric: neg Sleep : Restless leg   ALLERGIES: No Known Allergies  HOME MEDICATIONS: Outpatient Medications Prior to Visit  Medication Sig Dispense Refill  . amLODipine (NORVASC) 5 MG tablet Take 5 mg by mouth at bedtime.  1  . atorvastatin (LIPITOR) 80 MG tablet Take 1 tablet (80 mg total) by mouth daily at 6 PM. 30 tablet 1  . clopidogrel (PLAVIX) 75 MG tablet Take 1 tablet (75 mg total) by mouth daily. 30 tablet 1  . hydrochlorothiazide (MICROZIDE) 12.5 MG capsule Take 1 capsule (12.5 mg total) by mouth daily. 30 capsule 1  . lisinopril (PRINIVIL,ZESTRIL) 20 MG tablet Take 40 mg by mouth daily.      No facility-administered medications prior  to visit.     PAST MEDICAL HISTORY: Past Medical History:  Diagnosis Date  . Hypertension   . Obesity   . Stroke [facility](HCC)    ischemic stroke    PAST SURGICAL HISTORY: History reviewed. No pertinent surgical history.  FAMILY  HISTORY: Family History  Problem Relation Age of Onset  . CAD Mother        MI    SOCIAL HISTORY: Social History   Social History  . Marital status: Single    Spouse name: N/A  . Number of children: 2  . Years of education: N/A   Occupational History  . Not on file.   Social History Main Topics  . Smoking status: Never Smoker  . Smokeless tobacco: Never Used  . Alcohol use No  . Drug use: No  . Sexual activity: Not on file   Other Topics Concern  . Not on file   Social History Narrative   Live alone.   Caffeine avg 1-2 weekly   Right handed.     One son living.  (one passed).      PHYSICAL EXAM  Vitals:   07/23/17 1029  BP: (!) 154/82  Pulse: 67  Weight: 163 lb 6.4 oz (74.1 kg)   Body mass index is 25.59 kg/m.  Generalized: Well developed, in no acute distress  Head: normocephalic and atraumatic,. Oropharynx benign  Neck: Supple, no carotid bruits  Cardiac: Regular rate rhythm, no murmur  Musculoskeletal: No deformity   Neurological examination   Mentation: Alert oriented to time, place, history taking. Attention span and concentration appropriate. Recent and remote memory intact.  Follows all commands speech and language fluent.   Cranial nerve II-XII: .Pupils were equal round reactive to light extraocular movements were full, visual field were full on confrontational test. Facial sensation and strength were normal. hearing was intact to finger rubbing bilaterally. Uvula tongue midline. head turning and shoulder shrug were normal and symmetric.Tongue protrusion into cheek strength was normal. Motor: normal bulk and tone, full strength in the BUE, BLE, fine finger movements normal, no pronator drift. No focal weakness Sensory: normal and symmetric to light touch, pinprick, and  Vibration, in the upper and lower extremities  Coordination: finger-nose-finger, heel-to-shin bilaterally, no dysmetria Reflexes: 1+ upper lower and symmetric plantar responses  were flexor bilaterally. Gait and Station: Rising up from seated position without assistance, normal stance,  moderate stride, good arm swing, smooth turning, able to perform tiptoe, and heel walking without difficulty. Tandem gait is steady. No assistive device  DIAGNOSTIC DATA (LABS, IMAGING, TESTING) - I reviewed patient records, labs, notes, testing and imaging myself where available.  Lab Results  Component Value Date   WBC 5.4 06/26/2016   HGB 14.6 06/26/2016   HCT 43.0 06/26/2016   MCV 79.1 06/26/2016   PLT 187 06/26/2016      Component Value Date/Time   NA 140 06/27/2016 0722   K 3.8 06/27/2016 0722   CL 106 06/27/2016 0722   CO2 27 06/27/2016 0722   GLUCOSE 90 06/27/2016 0722   BUN 14 06/27/2016 0722   CREATININE 1.03 (H) 06/27/2016 0722   CALCIUM 9.5 06/27/2016 0722   PROT 7.4 06/26/2016 0515   ALBUMIN 3.5 06/26/2016 0515   AST 24 06/26/2016 0515   ALT 19 06/26/2016 0515   ALKPHOS 68 06/26/2016 0515   BILITOT 0.7 06/26/2016 0515   GFRNONAA 53 (L) 06/27/2016 0722   GFRAA >60 06/27/2016 0722   Lab Results  Component Value Date   CHOL  255 (H) 06/26/2016   HDL 56 06/26/2016   LDLCALC 189 (H) 06/26/2016   TRIG 49 06/26/2016   CHOLHDL 4.6 06/26/2016   Lab Results  Component Value Date   HGBA1C 5.9 (H) 06/26/2016    ASSESSMENT AND PLAN  71 y.o. year old female  has a past medical history of Hypertension; Obesity; and Stroke (HCC). here To follow-up for right lateral thalamic infarctin July 2017. MRA  Severe diffuse intracranial stenosis large vessels. 2-D echo EF 65-70% CUS 40-59% RIGHT ICA STENOSIS.  The patient is a current patient of Dr. Roda Shutters  who is out of the office today . This note is sent to the work in doctor.      PLAN: Continue Plavix for secondary stroke prevention  Maintain strict control of hypertension with blood pressure goal below 130/90, today's reading 154/82 continue antihypertensive medications  Cholesterol with LDL cholesterol less than 70,  followed by primary care, most recent 112 Continue Lipitor Exercise by walking, , eat healthy diet with whole grains,  fresh fruits and vegetables discharge from stroke clinic I spent 25 minutes in total face to face time with the patient more than 50% of which was spent counseling and coordination of care, reviewing test results reviewing medications and discussing and reviewing the diagnosis of stroke and management of risk factors. Given reference materials Nilda Riggs, [facility], [facility], APRN  [facility] [address], [address] [place], [place] [address] [phone]

## 2017-07-23 NOTE — Patient Instructions (Addendum)
Continue Plavix for secondary stroke prevention  Maintain strict control of hypertension with blood pressure goal below 130/90, today's reading 154/82 continue antihypertensive medications  Cholesterol with LDL cholesterol less than 70, followed by primary care, most recent 112 Continue Lipitor Exercise by walking, , eat healthy diet with whole grains,  fresh fruits and vegetables discharge from stroke clinic Stroke Prevention Some medical conditions and behaviors are associated with an increased chance of having a stroke. You may prevent a stroke by making healthy choices and managing medical conditions. How can I reduce my risk of having a stroke?  Stay physically active. Get at least 30 minutes of activity on most or all days.  Do not smoke. It may also be helpful to avoid exposure to secondhand smoke.  Limit alcohol use. Moderate alcohol use is considered to be: ? No more than 2 drinks per day for men. ? No more than 1 drink per day for nonpregnant women.  Eat healthy foods. This involves: ? Eating 5 or more servings of fruits and vegetables a day. ? Making dietary changes that address high blood pressure (hypertension), high cholesterol, diabetes, or obesity.  Manage your cholesterol levels. ? Making food choices that are high in fiber and low in saturated fat, trans fat, and cholesterol may control cholesterol levels. ? Take any prescribed medicines to control cholesterol as directed by your health care provider.  Manage your diabetes. ? Controlling your carbohydrate and sugar intake is recommended to manage diabetes. ? Take any prescribed medicines to control diabetes as directed by your health care provider.  Control your hypertension. ? Making food choices that are low in salt (sodium), saturated fat, trans fat, and cholesterol is recommended to manage hypertension. ? Ask your health care provider if you need treatment to lower your blood pressure. Take any prescribed  medicines to control hypertension as directed by your health care provider. ? If you are 4918-73 years of age, have your blood pressure checked every 3-5 years. If you are 73 years of age or older, have your blood pressure checked every year.  Maintain a healthy weight. ? Reducing calorie intake and making food choices that are low in sodium, saturated fat, trans fat, and cholesterol are recommended to manage weight.  Stop drug abuse.  Avoid taking birth control pills. ? Talk to your health care provider about the risks of taking birth control pills if you are over 73 years old, smoke, get migraines, or have ever had a blood clot.  Get evaluated for sleep disorders (sleep apnea). ? Talk to your health care provider about getting a sleep evaluation if you snore a lot or have excessive sleepiness.  Take medicines only as directed by your health care provider. ? For some people, aspirin or blood thinners (anticoagulants) are helpful in reducing the risk of forming abnormal blood clots that can lead to stroke. If you have the irregular heart rhythm of atrial fibrillation, you should be on a blood thinner unless there is a good reason you cannot take them. ? Understand all your medicine instructions.  Make sure that other conditions (such as anemia or atherosclerosis) are addressed. Get help right away if:  You have sudden weakness or numbness of the face, arm, or leg, especially on one side of the body.  Your face or eyelid droops to one side.  You have sudden confusion.  You have trouble speaking (aphasia) or understanding.  You have sudden trouble seeing in one or both eyes.  You have sudden  trouble walking.  You have dizziness.  You have a loss of balance or coordination.  You have a sudden, severe headache with no known cause.  You have new chest pain or an irregular heartbeat. Any of these symptoms may represent a serious problem that is an emergency. Do not wait to see if the  symptoms will go away. Get medical help at once. Call your local emergency services (911 in [place] Do not drive yourself to the hospital. This information is not intended to replace advice given to you by your health care provider. Make sure you discuss any questions you have with your health care provider. Document Released: 01/18/2005 Document Revised: 05/18/2016 Document Reviewed: 06/13/2013 Elsevier Interactive Patient Education  2017 [employer]

## 2017-08-09 DIAGNOSIS — H1851 Endothelial corneal dystrophy: Secondary | ICD-10-CM | POA: Diagnosis not present

## 2017-08-09 DIAGNOSIS — H35372 Puckering of macula, left eye: Secondary | ICD-10-CM | POA: Diagnosis not present

## 2017-08-09 DIAGNOSIS — Z961 Presence of intraocular lens: Secondary | ICD-10-CM | POA: Diagnosis not present

## 2017-08-09 DIAGNOSIS — H35352 Cystoid macular degeneration, left eye: Secondary | ICD-10-CM | POA: Diagnosis not present

## 2017-08-29 DIAGNOSIS — H1851 Endothelial corneal dystrophy: Secondary | ICD-10-CM | POA: Diagnosis not present

## 2017-08-29 DIAGNOSIS — Z961 Presence of intraocular lens: Secondary | ICD-10-CM | POA: Diagnosis not present

## 2017-08-29 DIAGNOSIS — H35372 Puckering of macula, left eye: Secondary | ICD-10-CM | POA: Diagnosis not present

## 2017-08-29 DIAGNOSIS — Z9841 Cataract extraction status, right eye: Secondary | ICD-10-CM | POA: Diagnosis not present

## 2018-03-08 DIAGNOSIS — I10 Essential (primary) hypertension: Secondary | ICD-10-CM | POA: Diagnosis not present

## 2018-03-08 DIAGNOSIS — I6320 Cerebral infarction due to unspecified occlusion or stenosis of unspecified precerebral arteries: Secondary | ICD-10-CM | POA: Diagnosis not present

## 2018-03-08 DIAGNOSIS — E782 Mixed hyperlipidemia: Secondary | ICD-10-CM | POA: Diagnosis not present

## 2018-03-26 DIAGNOSIS — E782 Mixed hyperlipidemia: Secondary | ICD-10-CM | POA: Diagnosis not present

## 2018-03-26 DIAGNOSIS — I6930 Unspecified sequelae of cerebral infarction: Secondary | ICD-10-CM | POA: Diagnosis not present

## 2018-03-26 DIAGNOSIS — I10 Essential (primary) hypertension: Secondary | ICD-10-CM | POA: Diagnosis not present

## 2018-03-26 DIAGNOSIS — Z79899 Other long term (current) drug therapy: Secondary | ICD-10-CM | POA: Diagnosis not present

## 2018-04-11 DIAGNOSIS — I10 Essential (primary) hypertension: Secondary | ICD-10-CM | POA: Diagnosis not present

## 2018-04-11 DIAGNOSIS — Z79899 Other long term (current) drug therapy: Secondary | ICD-10-CM | POA: Diagnosis not present

## 2018-04-11 DIAGNOSIS — E782 Mixed hyperlipidemia: Secondary | ICD-10-CM | POA: Diagnosis not present

## 2018-04-11 DIAGNOSIS — Z6826 Body mass index (BMI) 26.0-26.9, adult: Secondary | ICD-10-CM | POA: Diagnosis not present

## 2018-04-11 DIAGNOSIS — I6320 Cerebral infarction due to unspecified occlusion or stenosis of unspecified precerebral arteries: Secondary | ICD-10-CM | POA: Diagnosis not present

## 2018-04-11 DIAGNOSIS — E663 Overweight: Secondary | ICD-10-CM | POA: Diagnosis not present

## 2018-04-15 DIAGNOSIS — Z9841 Cataract extraction status, right eye: Secondary | ICD-10-CM | POA: Diagnosis not present

## 2018-04-15 DIAGNOSIS — Z961 Presence of intraocular lens: Secondary | ICD-10-CM | POA: Diagnosis not present

## 2018-04-15 DIAGNOSIS — H35372 Puckering of macula, left eye: Secondary | ICD-10-CM | POA: Diagnosis not present

## 2018-04-15 DIAGNOSIS — H1851 Endothelial corneal dystrophy: Secondary | ICD-10-CM | POA: Diagnosis not present

## 2018-05-02 DIAGNOSIS — S0990XA Unspecified injury of head, initial encounter: Secondary | ICD-10-CM | POA: Diagnosis not present

## 2018-05-02 DIAGNOSIS — R51 Headache: Secondary | ICD-10-CM | POA: Diagnosis not present

## 2018-05-02 DIAGNOSIS — R55 Syncope and collapse: Secondary | ICD-10-CM | POA: Diagnosis not present

## 2018-05-02 DIAGNOSIS — X58XXXA Exposure to other specified factors, initial encounter: Secondary | ICD-10-CM | POA: Diagnosis not present

## 2018-05-02 DIAGNOSIS — M25511 Pain in right shoulder: Secondary | ICD-10-CM | POA: Diagnosis not present

## 2018-05-02 DIAGNOSIS — Z5181 Encounter for therapeutic drug level monitoring: Secondary | ICD-10-CM | POA: Diagnosis not present

## 2018-05-02 DIAGNOSIS — I4510 Unspecified right bundle-branch block: Secondary | ICD-10-CM | POA: Diagnosis not present

## 2018-05-02 DIAGNOSIS — R42 Dizziness and giddiness: Secondary | ICD-10-CM | POA: Diagnosis not present

## 2018-05-02 DIAGNOSIS — W19XXXA Unspecified fall, initial encounter: Secondary | ICD-10-CM | POA: Diagnosis not present

## 2018-05-02 DIAGNOSIS — Z79899 Other long term (current) drug therapy: Secondary | ICD-10-CM | POA: Diagnosis not present

## 2018-07-17 DIAGNOSIS — R011 Cardiac murmur, unspecified: Secondary | ICD-10-CM | POA: Diagnosis not present

## 2018-07-17 DIAGNOSIS — I6320 Cerebral infarction due to unspecified occlusion or stenosis of unspecified precerebral arteries: Secondary | ICD-10-CM | POA: Diagnosis not present

## 2018-07-17 DIAGNOSIS — E782 Mixed hyperlipidemia: Secondary | ICD-10-CM | POA: Diagnosis not present

## 2018-07-17 DIAGNOSIS — R9389 Abnormal findings on diagnostic imaging of other specified body structures: Secondary | ICD-10-CM | POA: Diagnosis not present

## 2018-07-17 DIAGNOSIS — Z79899 Other long term (current) drug therapy: Secondary | ICD-10-CM | POA: Diagnosis not present

## 2018-07-17 DIAGNOSIS — I10 Essential (primary) hypertension: Secondary | ICD-10-CM | POA: Diagnosis not present

## 2018-07-17 DIAGNOSIS — E663 Overweight: Secondary | ICD-10-CM | POA: Diagnosis not present

## 2018-07-17 DIAGNOSIS — Z87898 Personal history of other specified conditions: Secondary | ICD-10-CM | POA: Diagnosis not present

## 2018-08-03 ENCOUNTER — Other Ambulatory Visit: Payer: Self-pay | Admitting: Family Medicine

## 2018-08-03 DIAGNOSIS — E041 Nontoxic single thyroid nodule: Secondary | ICD-10-CM

## 2018-08-22 ENCOUNTER — Ambulatory Visit
Admission: RE | Admit: 2018-08-22 | Discharge: 2018-08-22 | Disposition: A | Discharge status: Home / Self Care | Payer: Medicare Other | Source: Ambulatory Visit | Attending: Family Medicine | Admitting: Family Medicine

## 2018-08-22 DIAGNOSIS — E041 Nontoxic single thyroid nodule: Secondary | ICD-10-CM

## 2018-08-22 DIAGNOSIS — E042 Nontoxic multinodular goiter: Secondary | ICD-10-CM | POA: Diagnosis not present

## 2018-09-20 DIAGNOSIS — I6320 Cerebral infarction due to unspecified occlusion or stenosis of unspecified precerebral arteries: Secondary | ICD-10-CM | POA: Diagnosis not present

## 2018-09-20 DIAGNOSIS — E782 Mixed hyperlipidemia: Secondary | ICD-10-CM | POA: Diagnosis not present

## 2018-09-20 DIAGNOSIS — I10 Essential (primary) hypertension: Secondary | ICD-10-CM | POA: Diagnosis not present

## 2018-09-20 DIAGNOSIS — Z79899 Other long term (current) drug therapy: Secondary | ICD-10-CM | POA: Diagnosis not present

## 2018-09-20 DIAGNOSIS — E042 Nontoxic multinodular goiter: Secondary | ICD-10-CM | POA: Diagnosis not present

## 2018-09-20 DIAGNOSIS — Z23 Encounter for immunization: Secondary | ICD-10-CM | POA: Diagnosis not present

## 2018-09-20 DIAGNOSIS — M25511 Pain in right shoulder: Secondary | ICD-10-CM | POA: Diagnosis not present

## 2018-09-25 DIAGNOSIS — M67911 Unspecified disorder of synovium and tendon, right shoulder: Secondary | ICD-10-CM | POA: Diagnosis not present

## 2018-12-16 DIAGNOSIS — E782 Mixed hyperlipidemia: Secondary | ICD-10-CM | POA: Diagnosis not present

## 2018-12-16 DIAGNOSIS — R55 Syncope and collapse: Secondary | ICD-10-CM | POA: Diagnosis not present

## 2018-12-16 DIAGNOSIS — I6320 Cerebral infarction due to unspecified occlusion or stenosis of unspecified precerebral arteries: Secondary | ICD-10-CM | POA: Diagnosis not present

## 2018-12-16 DIAGNOSIS — I10 Essential (primary) hypertension: Secondary | ICD-10-CM | POA: Diagnosis not present

## 2018-12-16 DIAGNOSIS — E042 Nontoxic multinodular goiter: Secondary | ICD-10-CM | POA: Diagnosis not present

## 2018-12-16 DIAGNOSIS — Z79899 Other long term (current) drug therapy: Secondary | ICD-10-CM | POA: Diagnosis not present

## 2019-01-01 DIAGNOSIS — H1851 Endothelial corneal dystrophy: Secondary | ICD-10-CM | POA: Diagnosis not present

## 2019-01-01 DIAGNOSIS — Z9841 Cataract extraction status, right eye: Secondary | ICD-10-CM | POA: Diagnosis not present

## 2019-01-01 DIAGNOSIS — Z961 Presence of intraocular lens: Secondary | ICD-10-CM | POA: Diagnosis not present

## 2019-01-01 DIAGNOSIS — H35372 Puckering of macula, left eye: Secondary | ICD-10-CM | POA: Diagnosis not present

## 2019-02-07 IMAGING — US US THYROID
1 series · 13 of 25 positions shown · non-contrast
Comparison: None.

CLINICAL DATA: Nodule on physical exam

EXAM:
THYROID ULTRASOUND
TECHNIQUE: Ultrasound examination of the thyroid gland and adjacent soft
tissues was performed.

[Series 1: us thyroid · 0.05mm/px · 13 of 77 slices shown]
[im 1/77]
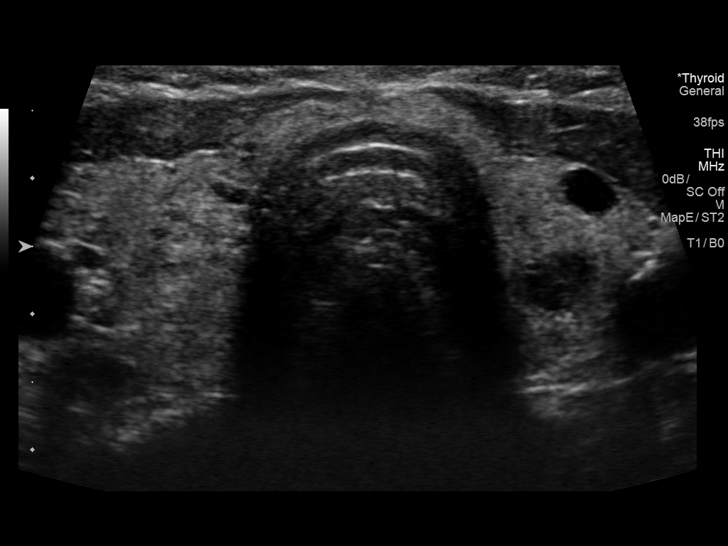
[im 7/77]
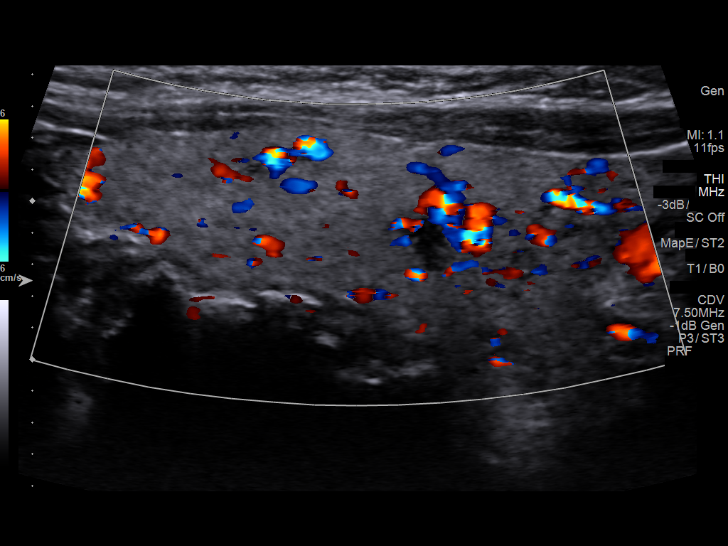
[im 13/77]
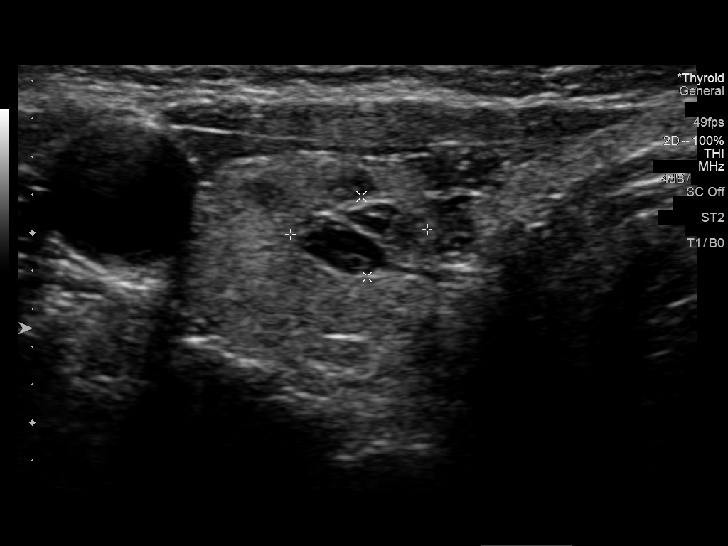
[im 20/77]
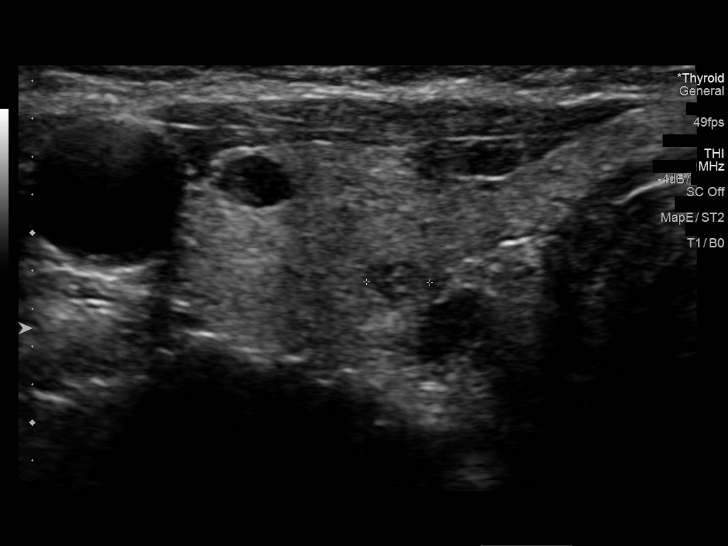
[im 26/77]
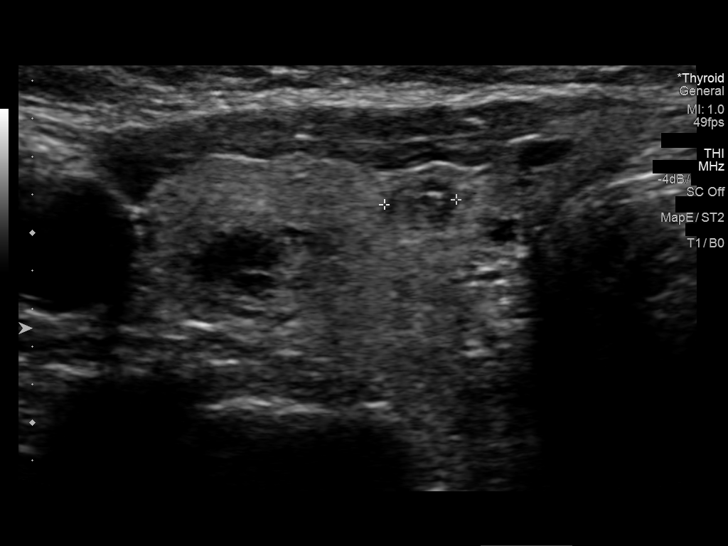
[im 32/77]
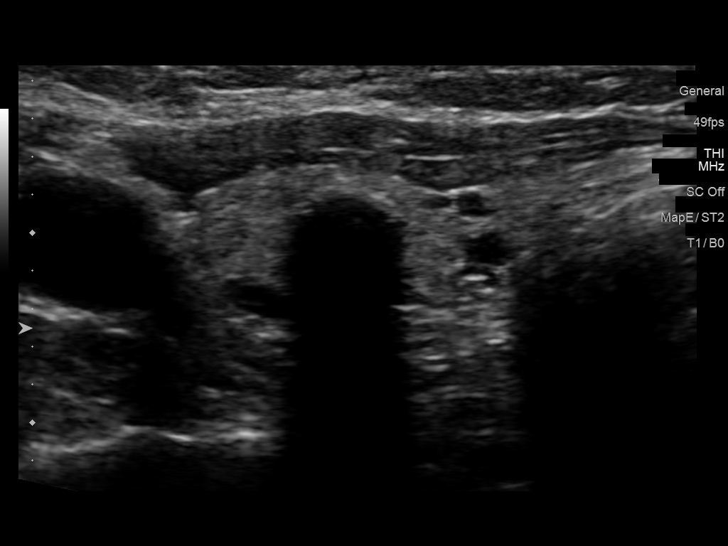
[im 39/77]
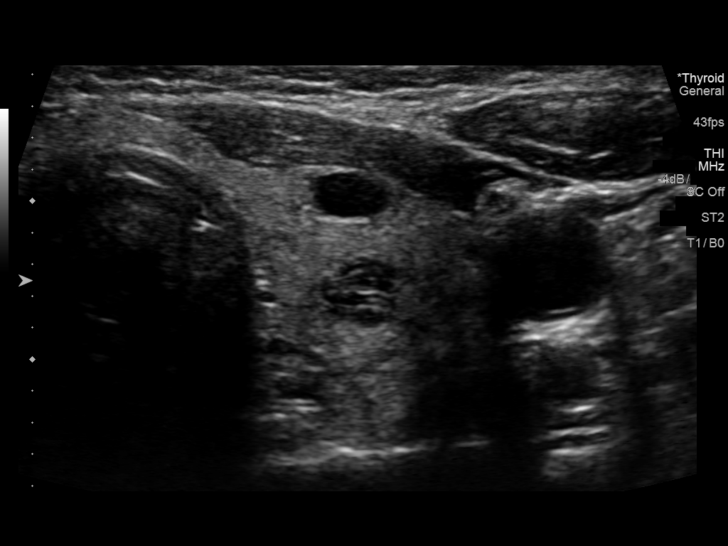
[im 45/77]
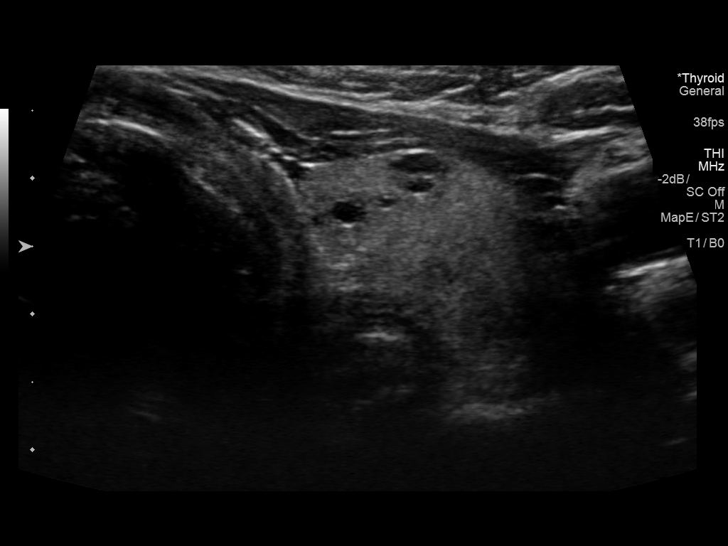
[im 51/77]
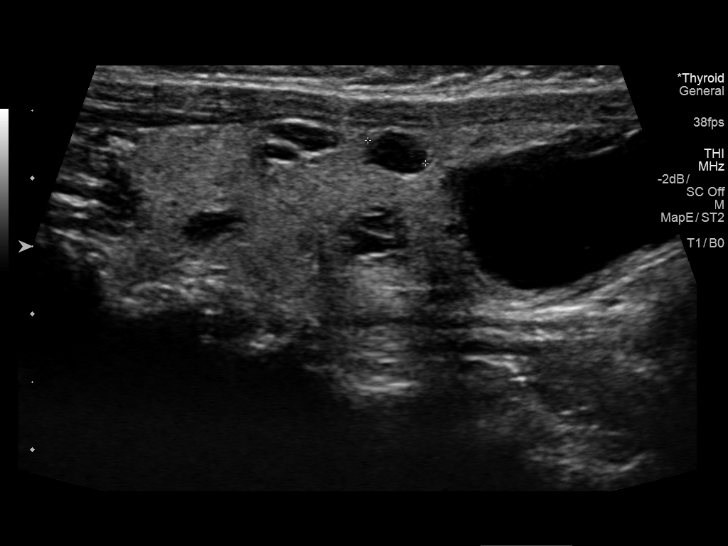
[im 58/77]
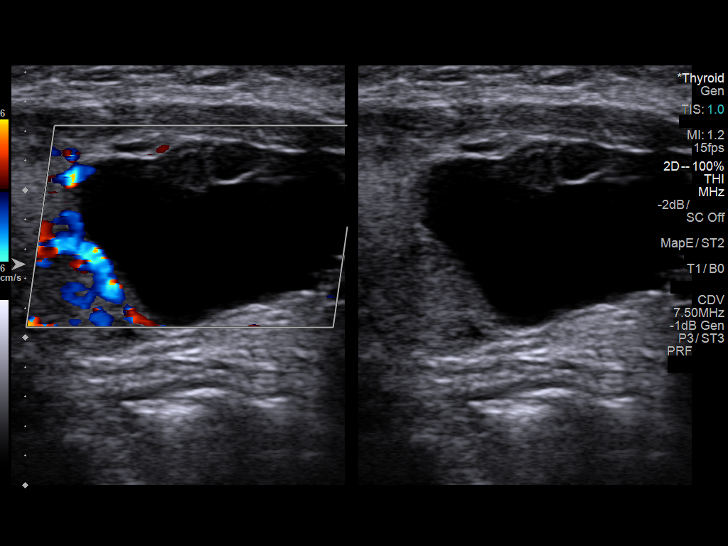
[im 64/77]
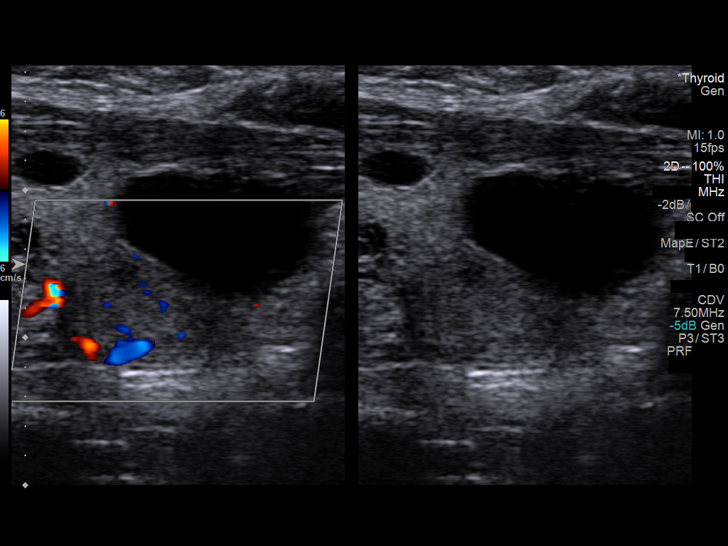
[im 70/77]
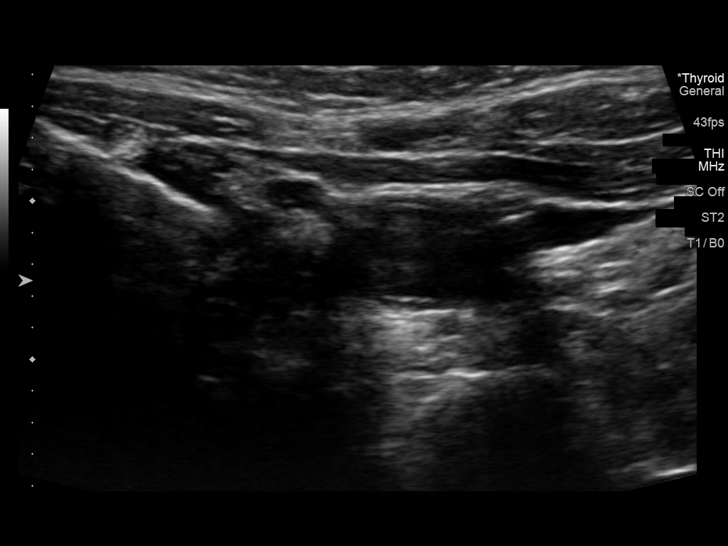
[im 77/77]
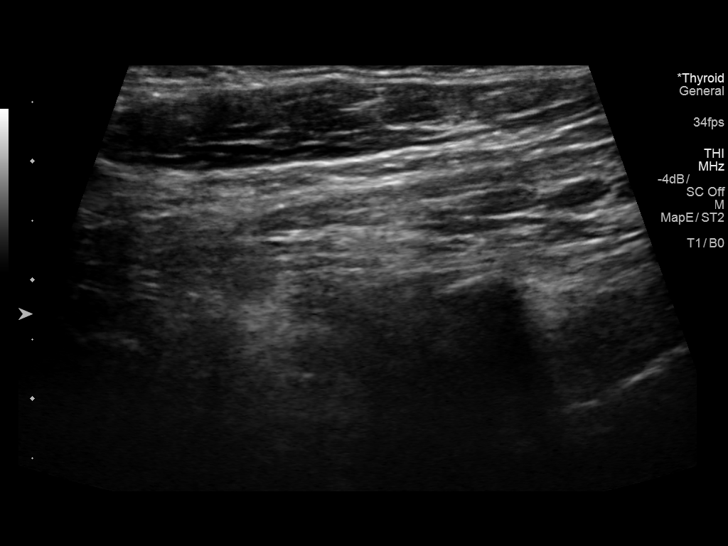

[13 of 25 positions shown; findings below may reference images not displayed]

FINDINGS: Parenchymal Echotexture: Mildly heterogenous

Isthmus: 0.3 cm thickness

Right lobe: 4 x 1.6 x 1.7 cm

Left lobe: 5 x 2.1 x 1.5 cm

_________________________________________________________

Estimated total number of nodules >/= 1 cm: 3

Number of spongiform nodules >/=  2 cm not described below (TR1): 0

Number of mixed cystic and solid nodules >/= 1.5 cm not described
below (TR2): 0

_________________________________________________________

0.7 cm mixed solid/cystic superior right nodule, does not meet
criteria for biopsy or dedicated imaging follow-up

1 cm mostly cystic mid right nodule, does not meet criteria for
biopsy or dedicated imaging follow-up

0.8 cm peripherally calcified inferior right lesion, does not meet
criteria for biopsy or dedicated imaging follow-up

0.6 cm spongiform nodule, superior left, does not meet criteria for
biopsy or dedicated imaging follow-up

0.6 cm colloid cyst, mid left, does not meet criteria for biopsy or
dedicated imaging follow-up

Nodule # 6:

Location: Left; Inferior

Maximum size: 1.9 cm; Other 2 dimensions: 1.6 x 1.1 cm

Composition: cystic/almost completely cystic (0)

Echogenicity: hypoechoic (2)

Shape: not taller-than-wide (0)

Margins: smooth (0)

Echogenic foci: none (0)

ACR TI-RADS total points: 2.

ACR TI-RADS risk category: TR2 (2 points).

ACR TI-RADS recommendations:

This nodule does NOT meet TI-RADS criteria for biopsy or dedicated
follow-up.

_________________________________________________________

Nodule # 7:

Location: Left; Inferior

Maximum size: 1.1 cm; Other 2 dimensions: 0.9 x 0.7 cm

Composition: solid/almost completely solid (2)

Echogenicity: hypoechoic (2)

Shape: not taller-than-wide (0)

Margins: ill-defined (0)

Echogenic foci: none (0)

ACR TI-RADS total points: 4.

ACR TI-RADS risk category: TR4 (4-6 points).

ACR TI-RADS recommendations:

*Given size (>/= 1 - 1.4 cm) and appearance, a follow-up ultrasound
in 1 year should be considered based on TI-RADS criteria.

_________________________________________________________
IMPRESSION: 1. Mild thyromegaly with bilateral nodules as above. None meets
current criteria for biopsy.
2. Recommend annual/biennial ultrasound follow-up of inferior left
1.1 cm TR4 nodule, until stability x5 years confirmed.

The above is in keeping with the ACR TI-RADS recommendations - [HOSPITAL] 2585;[DATE].

## 2019-08-26 DIAGNOSIS — Z79899 Other long term (current) drug therapy: Secondary | ICD-10-CM | POA: Diagnosis not present

## 2019-08-26 DIAGNOSIS — E042 Nontoxic multinodular goiter: Secondary | ICD-10-CM | POA: Diagnosis not present

## 2019-08-26 DIAGNOSIS — R011 Cardiac murmur, unspecified: Secondary | ICD-10-CM | POA: Diagnosis not present

## 2019-08-26 DIAGNOSIS — Z23 Encounter for immunization: Secondary | ICD-10-CM | POA: Diagnosis not present

## 2019-08-26 DIAGNOSIS — I6320 Cerebral infarction due to unspecified occlusion or stenosis of unspecified precerebral arteries: Secondary | ICD-10-CM | POA: Diagnosis not present

## 2019-08-26 DIAGNOSIS — E782 Mixed hyperlipidemia: Secondary | ICD-10-CM | POA: Diagnosis not present

## 2019-08-26 DIAGNOSIS — I10 Essential (primary) hypertension: Secondary | ICD-10-CM | POA: Diagnosis not present

## 2019-09-04 DIAGNOSIS — H35352 Cystoid macular degeneration, left eye: Secondary | ICD-10-CM | POA: Diagnosis not present

## 2019-09-04 DIAGNOSIS — H35372 Puckering of macula, left eye: Secondary | ICD-10-CM | POA: Diagnosis not present

## 2019-09-04 DIAGNOSIS — Z961 Presence of intraocular lens: Secondary | ICD-10-CM | POA: Diagnosis not present

## 2019-09-04 DIAGNOSIS — H1851 Endothelial corneal dystrophy: Secondary | ICD-10-CM | POA: Diagnosis not present

## 2020-01-02 ENCOUNTER — Encounter (INDEPENDENT_AMBULATORY_CARE_PROVIDER_SITE_OTHER): Payer: Self-pay

## 2020-01-02 ENCOUNTER — Other Ambulatory Visit: Payer: Self-pay

## 2020-01-02 ENCOUNTER — Ambulatory Visit: Payer: Medicare Other | Admitting: Cardiovascular Disease

## 2020-01-02 ENCOUNTER — Encounter: Payer: Self-pay | Admitting: Cardiovascular Disease

## 2020-01-02 ENCOUNTER — Ambulatory Visit (INDEPENDENT_AMBULATORY_CARE_PROVIDER_SITE_OTHER): Payer: Medicare Other | Admitting: Cardiovascular Disease

## 2020-01-02 VITALS — BP 134/78 | HR 78 | Ht 67.0 in | Wt 177.6 lb

## 2020-01-02 DIAGNOSIS — R9431 Abnormal electrocardiogram [ECG] [EKG]: Secondary | ICD-10-CM | POA: Diagnosis not present

## 2020-01-02 DIAGNOSIS — R0989 Other specified symptoms and signs involving the circulatory and respiratory systems: Secondary | ICD-10-CM | POA: Diagnosis not present

## 2020-01-02 DIAGNOSIS — E782 Mixed hyperlipidemia: Secondary | ICD-10-CM

## 2020-01-02 DIAGNOSIS — R011 Cardiac murmur, unspecified: Secondary | ICD-10-CM

## 2020-01-02 DIAGNOSIS — I350 Nonrheumatic aortic (valve) stenosis: Secondary | ICD-10-CM

## 2020-01-02 DIAGNOSIS — I10 Essential (primary) hypertension: Secondary | ICD-10-CM | POA: Diagnosis not present

## 2020-01-02 NOTE — Patient Instructions (Signed)
Medication Instructions:  Your physician recommends that you continue on your current medications as directed. Please refer to the Current Medication list given to you today.  If you need a refill on your cardiac medications before your next appointment, please call your pharmacy.   Lab work: NONE  Testing/Procedures: Your physician has requested that you have an echocardiogram. Echocardiography is a painless test that uses sound waves to create images of your heart. It provides your doctor with information about the size and shape of your heart and how well your heart's chambers and valves are working. This procedure takes approximately one hour. There are no restrictions for this procedure. [address]  AND  Your physician has requested that you have a carotid duplex. This test is an ultrasound of the carotid arteries in your neck. It looks at blood flow through these arteries that supply the brain with blood. Allow one hour for this exam. There are no restrictions or special instructions.  Follow-Up: At [facility], you and your health needs are our priority.  As part of our continuing mission to provide you with exceptional heart care, we have created designated Provider Care Teams.  These Care Teams include your primary Cardiologist (physician) and Advanced Practice Providers (APPs -  Physician Assistants and Nurse Practitioners) who all work together to provide you with the care you need, when you need it. You may see Dr Allyson Sabal or one of the following Advanced Practice Providers on your designated Care Team:    Corine Shelter, PA-C  [place], [place]  Edd Fabian, [place]  Your physician wants you to follow-up in: 1 year with Dr Allyson Sabal

## 2020-01-02 NOTE — Assessment & Plan Note (Signed)
Patient had mild aortic stenosis by echo 06/26/2016.  She has a 2/6 outflow tract murmur.  She is completely asymptomatic from this.  I am going to repeat a 2D echocardiogram to further evaluate.

## 2020-01-02 NOTE — Assessment & Plan Note (Signed)
History of hyperlipidemia on statin therapy with lipid profile performed 12/16/2019 revealing a total cholesterol of 136, LDL 65 and HDL of 60

## 2020-01-02 NOTE — Assessment & Plan Note (Signed)
Right bundle branch block

## 2020-01-02 NOTE — Assessment & Plan Note (Signed)
History essential potential blood pressure measured today 134/78.  She is on lisinopril, amlodipine and hydrochlorothiazide.

## 2020-01-02 NOTE — Progress Notes (Signed)
01/02/2020 Allison Schneider   1946/04/18  [id number]  Primary Physician Smothers, Cathleen Corti, NP Primary Cardiologist: Runell Gess MD [name], [name]  HPI:  Allison Schneider is a 74 y.o. thin appearing widowed African-American female mother of 1 living child (1 deceased), grandmother of 3 grandchildren referred to me by Dr. Renaldo Reel for an auscultated cardiac murmur.  She is retired Teacher, early years/pre.  Risk factors include treated hypertension hyperlipidemia.  She is never had a heart attack but apparently did have a slight stroke in 2016.  She denies chest pain or shortness of breath.  She did have a 2D echo performed 06/26/2016 that showed normal LV function with mild aortic stenosis.   No outpatient medications have been marked as taking for the 01/02/20 encounter (Office Visit) with Runell Gess, MD.     No Known Allergies  Social History   Socioeconomic History  . Marital status: Single    Spouse name: Not on file  . Number of children: 2  . Years of education: Not on file  . Highest education level: Not on file  Occupational History  . Not on file  Tobacco Use  . Smoking status: Never Smoker  . Smokeless tobacco: Never Used  Substance and Sexual Activity  . Alcohol use: No    Alcohol/week: 0.0 standard drinks  . Drug use: No  . Sexual activity: Not on file  Other Topics Concern  . Not on file  Social History Narrative   Live alone.   Caffeine avg 1-2 weekly   Right handed.     One son living.  (one passed).    Social Determinants of Health   Financial Resource Strain:   . Difficulty of Paying Living Expenses: Not on file  Food Insecurity:   . Worried About Programme researcher, broadcasting/film/video in the Last Year: Not on file  . Ran Out of Food in the Last Year: Not on file  Transportation Needs:   . Lack of Transportation (Medical): Not on file  . Lack of Transportation (Non-Medical): Not on file  Physical Activity:   . Days of Exercise per Week: Not on file  . Minutes  of Exercise per Session: Not on file  Stress:   . Feeling of Stress : Not on file  Social Connections:   . Frequency of Communication with Friends and Family: Not on file  . Frequency of Social Gatherings with Friends and Family: Not on file  . Attends Religious Services: Not on file  . Active Member of Clubs or Organizations: Not on file  . Attends Banker Meetings: Not on file  . Marital Status: Not on file  Intimate Partner Violence:   . Fear of Current or Ex-Partner: Not on file  . Emotionally Abused: Not on file  . Physically Abused: Not on file  . Sexually Abused: Not on file     Review of Systems: General: negative for chills, fever, night sweats or weight changes.  Cardiovascular: negative for chest pain, dyspnea on exertion, edema, orthopnea, palpitations, paroxysmal nocturnal dyspnea or shortness of breath Dermatological: negative for rash Respiratory: negative for cough or wheezing Urologic: negative for hematuria Abdominal: negative for nausea, vomiting, diarrhea, bright red blood per rectum, melena, or hematemesis Neurologic: negative for visual changes, syncope, or dizziness All other systems reviewed and are otherwise negative except as noted above.    Blood pressure 134/78, pulse 78, height 5\' 7"  (1.702 m), weight 177 lb 9.6 oz (80.6 kg),  SpO2 98 %.  General appearance: alert and no distress Neck: no adenopathy, no JVD, supple, symmetrical, trachea midline, thyroid not enlarged, symmetric, no tenderness/mass/nodules and Soft bilateral carotid bruits versus transmitted murmur Lungs: clear to auscultation bilaterally Heart: regular rate and rhythm, S1, S2 normal, no murmur, click, rub or gallop and 2/6 outflow tract murmur consistent with aortic stenosis Extremities: extremities normal, atraumatic, no cyanosis or edema Pulses: 2+ and symmetric Skin: Skin color, texture, turgor normal. No rashes or lesions Neurologic: Alert and oriented X 3, normal  strength and tone. Normal symmetric reflexes. Normal coordination and gait  EKG sinus rhythm at 78 with right bundle branch block and left axis deviation.  I personally reviewed this EKG.  ASSESSMENT AND PLAN:   Hypertension History essential potential blood pressure measured today 134/78.  She is on lisinopril, amlodipine and hydrochlorothiazide.  Abnormal EKG Right bundle branch block  HLD (hyperlipidemia) History of hyperlipidemia on statin therapy with lipid profile performed 12/16/2019 revealing a total cholesterol of 136, LDL 65 and HDL of 60  Aortic stenosis Patient had mild aortic stenosis by echo 06/26/2016.  She has a 2/6 outflow tract murmur.  She is completely asymptomatic from this.  I am going to repeat a 2D echocardiogram to further evaluate.      Lorretta Harp MD FACP,FACC,FAHA, [facility] 01/02/2020 11:15 AM

## 2020-01-06 ENCOUNTER — Other Ambulatory Visit: Payer: Self-pay

## 2020-01-06 ENCOUNTER — Other Ambulatory Visit (HOSPITAL_COMMUNITY): Payer: Self-pay | Admitting: Cardiovascular Disease

## 2020-01-06 ENCOUNTER — Ambulatory Visit (HOSPITAL_COMMUNITY)
Admission: RE | Admit: 2020-01-06 | Discharge: 2020-01-06 | Disposition: A | Discharge status: Home / Self Care | Payer: Medicare Other | Source: Ambulatory Visit | Attending: Cardiovascular Disease | Admitting: Cardiovascular Disease

## 2020-01-06 DIAGNOSIS — R011 Cardiac murmur, unspecified: Secondary | ICD-10-CM | POA: Insufficient documentation

## 2020-01-06 DIAGNOSIS — I2583 Coronary atherosclerosis due to lipid rich plaque: Secondary | ICD-10-CM

## 2020-01-06 DIAGNOSIS — I6523 Occlusion and stenosis of bilateral carotid arteries: Secondary | ICD-10-CM

## 2020-01-06 DIAGNOSIS — R0989 Other specified symptoms and signs involving the circulatory and respiratory systems: Secondary | ICD-10-CM | POA: Diagnosis present

## 2020-01-06 DIAGNOSIS — I2510 Atherosclerotic heart disease of native coronary artery without angina pectoris: Secondary | ICD-10-CM

## 2020-01-15 ENCOUNTER — Other Ambulatory Visit: Payer: Self-pay

## 2020-01-15 ENCOUNTER — Ambulatory Visit (HOSPITAL_COMMUNITY): Payer: Medicare Other | Attending: Internal Medicine

## 2020-01-15 DIAGNOSIS — R011 Cardiac murmur, unspecified: Secondary | ICD-10-CM | POA: Insufficient documentation

## 2020-01-15 DIAGNOSIS — R0989 Other specified symptoms and signs involving the circulatory and respiratory systems: Secondary | ICD-10-CM | POA: Diagnosis present

## 2020-01-16 ENCOUNTER — Other Ambulatory Visit: Payer: Self-pay

## 2020-01-16 DIAGNOSIS — I350 Nonrheumatic aortic (valve) stenosis: Secondary | ICD-10-CM

## 2020-11-24 ENCOUNTER — Encounter (HOSPITAL_COMMUNITY): Payer: Self-pay | Admitting: Cardiovascular Disease

## 2020-12-06 ENCOUNTER — Telehealth (HOSPITAL_COMMUNITY): Payer: Self-pay | Admitting: Cardiovascular Disease

## 2020-12-06 NOTE — Telephone Encounter (Signed)
Just an FYI. We have made several attempts to contact this patient including sending a letter to schedule or reschedule their echocardiogram. We will be removing the patient from the echo WQ.  Patient has not called to schedule:  11/24/20 called HM# and cell# and both numbers do not work-MAILED LETTER /LBW 03/18/20 Called to schedule Echo for Dec/LB x 1 @ 1:54/LBW    Thank you

## 2020-12-30 ENCOUNTER — Ambulatory Visit (HOSPITAL_COMMUNITY)
Admission: RE | Admit: 2020-12-30 | Discharge: 2020-12-30 | Disposition: A | Discharge status: Home / Self Care | Payer: Medicare Other | Source: Ambulatory Visit | Attending: Cardiovascular Disease | Admitting: Cardiovascular Disease

## 2020-12-30 ENCOUNTER — Other Ambulatory Visit: Payer: Self-pay

## 2020-12-30 DIAGNOSIS — I6523 Occlusion and stenosis of bilateral carotid arteries: Secondary | ICD-10-CM

## 2020-12-30 DIAGNOSIS — I2510 Atherosclerotic heart disease of native coronary artery without angina pectoris: Secondary | ICD-10-CM | POA: Diagnosis not present

## 2020-12-30 DIAGNOSIS — I2583 Coronary atherosclerosis due to lipid rich plaque: Secondary | ICD-10-CM | POA: Diagnosis not present

## 2021-01-05 ENCOUNTER — Encounter (HOSPITAL_COMMUNITY): Payer: Medicare Other

## 2021-01-13 ENCOUNTER — Encounter (HOSPITAL_COMMUNITY): Payer: Medicare Other

## 2022-08-29 ENCOUNTER — Other Ambulatory Visit (HOSPITAL_COMMUNITY): Payer: Self-pay | Admitting: Family Medicine

## 2022-08-29 DIAGNOSIS — R011 Cardiac murmur, unspecified: Secondary | ICD-10-CM

## 2022-08-30 ENCOUNTER — Ambulatory Visit (HOSPITAL_COMMUNITY)
Admission: RE | Admit: 2022-08-30 | Discharge: 2022-08-30 | Disposition: A | Discharge status: Home / Self Care | Payer: Medicare Other | Source: Ambulatory Visit | Attending: Internal Medicine | Admitting: Internal Medicine

## 2022-08-30 DIAGNOSIS — I350 Nonrheumatic aortic (valve) stenosis: Secondary | ICD-10-CM | POA: Diagnosis not present

## 2022-08-30 DIAGNOSIS — R5383 Other fatigue: Secondary | ICD-10-CM | POA: Diagnosis not present

## 2022-08-30 DIAGNOSIS — R011 Cardiac murmur, unspecified: Secondary | ICD-10-CM | POA: Diagnosis not present

## 2022-08-30 DIAGNOSIS — Z8249 Family history of ischemic heart disease and other diseases of the circulatory system: Secondary | ICD-10-CM | POA: Diagnosis not present

## 2022-08-30 DIAGNOSIS — R42 Dizziness and giddiness: Secondary | ICD-10-CM | POA: Insufficient documentation

## 2022-08-30 DIAGNOSIS — I119 Hypertensive heart disease without heart failure: Secondary | ICD-10-CM | POA: Diagnosis not present

## 2022-08-30 DIAGNOSIS — R0602 Shortness of breath: Secondary | ICD-10-CM | POA: Diagnosis not present

## 2022-08-30 LAB — ECHOCARDIOGRAM COMPLETE
AR max vel: 0.91 cm2
AV Area VTI: 0.87 cm2
AV Area mean vel: 0.91 cm2
AV Mean grad: 26 mmHg
AV Peak grad: 43.3 mmHg
Ao pk vel: 3.29 m/s
Area-P 1/2: 3.81 cm2
S' Lateral: 1.6 cm

## 2022-09-01 ENCOUNTER — Other Ambulatory Visit: Payer: Self-pay | Admitting: Family Medicine

## 2022-09-01 DIAGNOSIS — R188 Other ascites: Secondary | ICD-10-CM

## 2022-09-04 ENCOUNTER — Other Ambulatory Visit: Payer: Self-pay | Admitting: Family Medicine

## 2022-09-04 ENCOUNTER — Encounter: Payer: Self-pay | Admitting: Cardiovascular Disease

## 2022-09-04 ENCOUNTER — Ambulatory Visit
Admission: RE | Admit: 2022-09-04 | Discharge: 2022-09-04 | Disposition: A | Discharge status: Home / Self Care | Payer: Medicare Other | Source: Ambulatory Visit | Attending: Family Medicine | Admitting: Family Medicine

## 2022-09-04 DIAGNOSIS — R188 Other ascites: Secondary | ICD-10-CM

## 2022-09-06 ENCOUNTER — Other Ambulatory Visit: Payer: Self-pay | Admitting: Family Medicine

## 2022-09-06 DIAGNOSIS — R1901 Right upper quadrant abdominal swelling, mass and lump: Secondary | ICD-10-CM

## 2023-01-01 ENCOUNTER — Other Ambulatory Visit: Payer: Self-pay | Admitting: Family Medicine

## 2023-01-01 DIAGNOSIS — R1901 Right upper quadrant abdominal swelling, mass and lump: Secondary | ICD-10-CM

## 2023-01-05 ENCOUNTER — Ambulatory Visit
Admission: RE | Admit: 2023-01-05 | Discharge: 2023-01-05 | Disposition: A | Discharge status: Home / Self Care | Payer: Medicare Other | Source: Ambulatory Visit | Attending: Family Medicine | Admitting: Family Medicine

## 2023-01-05 DIAGNOSIS — R1901 Right upper quadrant abdominal swelling, mass and lump: Secondary | ICD-10-CM

## 2023-01-05 MED ORDER — GADOPICLENOL 0.5 MMOL/ML IV SOLN
7.0000 mL | Freq: Once | INTRAVENOUS | Status: AC | PRN
  Administered 2023-01-05: 7 mL via INTRAVENOUS

## 2023-01-23 DIAGNOSIS — K7689 Other specified diseases of liver: Secondary | ICD-10-CM

## 2023-01-23 HISTORY — DX: Other specified diseases of liver: K76.89

## 2023-01-31 ENCOUNTER — Other Ambulatory Visit: Payer: Self-pay

## 2023-01-31 NOTE — Progress Notes (Signed)
The proposed treatment discussed in conference is for discussion purpose only and is not a binding recommendation.  The patients have not been physically examined, or presented with their treatment options.  Therefore, final treatment plans cannot be decided.  

## 2023-07-27 ENCOUNTER — Other Ambulatory Visit: Payer: Self-pay | Admitting: Family Medicine

## 2023-07-27 DIAGNOSIS — Z1231 Encounter for screening mammogram for malignant neoplasm of breast: Secondary | ICD-10-CM

## 2023-07-31 ENCOUNTER — Other Ambulatory Visit: Payer: Self-pay | Admitting: Family Medicine

## 2023-07-31 DIAGNOSIS — E2839 Other primary ovarian failure: Secondary | ICD-10-CM

## 2023-08-08 ENCOUNTER — Ambulatory Visit
Admission: RE | Admit: 2023-08-08 | Discharge: 2023-08-08 | Disposition: A | Discharge status: Home / Self Care | Payer: Medicare Other | Source: Ambulatory Visit | Attending: Family Medicine | Admitting: Family Medicine

## 2023-08-08 DIAGNOSIS — Z1231 Encounter for screening mammogram for malignant neoplasm of breast: Secondary | ICD-10-CM

## 2023-08-13 ENCOUNTER — Other Ambulatory Visit: Payer: Self-pay | Admitting: Family Medicine

## 2023-08-13 ENCOUNTER — Telehealth: Payer: Self-pay

## 2023-08-13 DIAGNOSIS — R928 Other abnormal and inconclusive findings on diagnostic imaging of breast: Secondary | ICD-10-CM

## 2023-08-13 NOTE — Telephone Encounter (Signed)
I left a vm letting pt know that pt has not been seen since 2021 and that she is now a new pt. Message that I left also stated that she can call back to schedule an appt.

## 2023-08-13 NOTE — Telephone Encounter (Signed)
Pts last OV was 01/02/20 with Dr. Allyson Sabal. Pt will need New Pt appt.     Pre-operative Risk Assessment    Patient Name: Allison Schneider  DOB: [birth year] MRN: [id number]      Request for Surgical Clearance    Procedure:   Hepatic Cyst Excision  Date of Surgery:  Clearance TBD                                 Surgeon:  Sophronia Simas, MD Surgeon's Group or Practice Name:  [facility] Phone number:  [phone] Fax number:  [phone] Campbell Stall, CMA   Type of Clearance Requested:   - Medical  - Pharmacy:  Hold Clopidogrel (Plavix) pt will need instructions on when/if to hold   Type of Anesthesia:  General    Additional requests/questions:    Wynetta Fines   08/13/2023, 7:51 AM

## 2023-08-14 NOTE — Telephone Encounter (Signed)
Will send a message to requesting office the pt needs to call the office to schedule a NEW PT APPT FOR PRE OP CLEARANCE.

## 2023-08-15 NOTE — Telephone Encounter (Signed)
Scheduling team has called and left message for the pt: I left a VM for Allison Schneider. We have an opening in [place] tomorrow we can hopefully get her in for it's our closest office to her.

## 2023-08-16 NOTE — Telephone Encounter (Signed)
New patient appt scheduled for 08/24/23 with Dr Bing Matter.   Left message on the patients voicemail advising that clearance will be discussed at appt on 08/24/23. Requested a call back with any questions or concerns.

## 2023-08-24 ENCOUNTER — Ambulatory Visit: Payer: Medicare Other | Admitting: Cardiology

## 2023-09-10 ENCOUNTER — Ambulatory Visit: Payer: Medicare Other | Admitting: Cardiology

## 2023-09-10 ENCOUNTER — Encounter: Payer: Self-pay | Admitting: Interventional Cardiology

## 2023-09-10 ENCOUNTER — Ambulatory Visit: Payer: Medicare Other | Attending: Cardiology | Admitting: Interventional Cardiology

## 2023-09-10 VITALS — BP 130/70 | HR 69 | Ht 67.0 in | Wt 158.0 lb

## 2023-09-10 DIAGNOSIS — E782 Mixed hyperlipidemia: Secondary | ICD-10-CM

## 2023-09-10 DIAGNOSIS — I10 Essential (primary) hypertension: Secondary | ICD-10-CM | POA: Diagnosis not present

## 2023-09-10 DIAGNOSIS — I6523 Occlusion and stenosis of bilateral carotid arteries: Secondary | ICD-10-CM

## 2023-09-10 DIAGNOSIS — I4510 Unspecified right bundle-branch block: Secondary | ICD-10-CM

## 2023-09-10 DIAGNOSIS — I350 Nonrheumatic aortic (valve) stenosis: Secondary | ICD-10-CM

## 2023-09-10 NOTE — Progress Notes (Addendum)
Cardiology Office Note   Date:  09/10/2023   ID:  Allison Schneider, DOB May 17, 1946, MRN [id number]  PCP:  Ileana Ladd, MD (Inactive)    No chief complaint on file.  Preoperative cardiovascular exam  Wt Readings from Last 3 Encounters:  09/10/23 158 lb (71.7 kg)  01/02/20 177 lb 9.6 oz (80.6 kg)  07/23/17 163 lb 6.4 oz (74.1 kg)       History of Present Illness: Allison Schneider is a 77 y.o. female who is being seen today for the evaluation of preoperative cardiovascular eval at the request of Fritzi Mandes, MD.   She has a large abdominal cyst and requires surgery.   No prior cardiac issues that she is aware of.   September 2023: "Left ventricular ejection fraction, by estimation, is >75%. The left  ventricle has hyperdynamic function. The left ventricle has no regional  wall motion abnormalities. There is moderate asymmetric left ventricular  hypertrophy of the basal-septal  segment. Left ventricular diastolic parameters are consistent with Grade I  diastolic dysfunction (impaired relaxation).   2. Right ventricular systolic function is normal. The right ventricular  size is normal. There is normal pulmonary artery systolic pressure. The  estimated right ventricular systolic pressure is 21.3 mmHg.   3. The aortic valve is tricuspid. There is moderate calcification of the  aortic valve. Aortic valve regurgitation is not visualized. Moderate to  severe aortic valve stenosis. Aortic valve area, by VTI measures 0.87 cm.  Aortic valve mean gradient  measures 26.0 mmHg. Aortic valve Vmax measures 3.29 m/s. Peak gradient  43.3 mmHg. DI is 0.25.   4. The inferior vena cava is normal in size with greater than 50%  respiratory variability, suggesting right atrial pressure of 3 mmHg.   5. The mitral valve is abnormal. Trivial mitral valve regurgitation.   Conclusion(s)/Recommendation(s): Large subcostal echolucent region which  may represent perihepatic ascites - recommend  dedicated abdominal imaging  to further evalute.   Past Medical History:  Diagnosis Date   Hypertension    Obesity    Stroke [facility])    ischemic stroke    Past Surgical History:  Procedure Laterality Date   BREAST BIOPSY       Current Outpatient Medications  Medication Sig Dispense Refill   amLODipine (NORVASC) 5 MG tablet Take 5 mg by mouth at bedtime.  1   atorvastatin (LIPITOR) 80 MG tablet Take 1 tablet (80 mg total) by mouth daily at 6 PM. 30 tablet 1   clopidogrel (PLAVIX) 75 MG tablet Take 1 tablet (75 mg total) by mouth daily. 30 tablet 1   hydrochlorothiazide (MICROZIDE) 12.5 MG capsule Take 1 capsule (12.5 mg total) by mouth daily. 30 capsule 1   Rosuvastatin Calcium 20 MG CPSP Take 20 mg by mouth daily.     UNABLE TO FIND Take 1 tablet by mouth daily. Vitamin d 3     No current facility-administered medications for this visit.    Allergies:   Patient has no known allergies.    Social History:  The patient  reports that she has never smoked. She has never used smokeless tobacco. She reports that she does not drink alcohol and does not use drugs.   Family History:  The patient's family history includes Breast cancer in her sister; CAD in her mother.    ROS:  Please see the history of present illness.   Otherwise, review of systems are positive for abdominal swelling.   All other systems are  reviewed and negative.    PHYSICAL EXAM: VS:  BP 130/70   Pulse 69   Ht 5\' 7"  (1.702 m)   Wt 158 lb (71.7 kg)   SpO2 96%   BMI 24.75 kg/m  , BMI Body mass index is 24.75 kg/m. GEN: Well nourished, well developed, in no acute distress; slightly slower speech HEENT: normal Neck: no JVD, bilateral carotid bruits, no masses Cardiac: RRR; 2/6 murmurs, rubs, or gallops,no edema  Respiratory:  clear to auscultation bilaterally, normal work of breathing GI: soft, nontender, nondistended, + BS MS: no deformity or atrophy Skin: warm and dry, no rash Neuro:  Strength and  sensation are intact Psych: euthymic mood, full affect   EKG:   The ekg ordered today demonstrates NSR, RBBB, no change from 2017   Recent Labs: No results found for requested labs within last 365 days.   Lipid Panel    Component Value Date/Time   CHOL 255 (H) 06/26/2016 0515   TRIG 49 06/26/2016 0515   HDL 56 06/26/2016 0515   CHOLHDL 4.6 06/26/2016 0515   VLDL 10 06/26/2016 0515   LDLCALC 189 (H) 06/26/2016 0515     Other studies Reviewed: Additional studies/ records that were reviewed today with results demonstrating: 2023 echo reviewed.   ASSESSMENT AND PLAN:  Preoperative cardiovascular exam: Abdominal surgery with Dr. Sophronia Simas.  Repeat echo for AS.  Seems to have adequate exercise tolerance.  [place]  Walks up small stairs in the [place], no cardiac sx.  I think she can have her surgery.  COuld hold Plavix 5 days prior to surgery. Hyperlipidemia: Had muscle pains with atorvastatin.  Switched to rosuvastatin 3 x/week.  She may be taking both.  Need to clarify what she is taking at home. Given muscle aches, would have her take rosuvastatin 3x/week HTN: The current medical regimen is effective;  continue present plan and medications.  Aortic stenosis: Moderate in 2023.  Repeat echo. Needs regular cardiac f/u for AS and routine carotid DOpplers checked for carotid disease from 2022. RBBB: chronic.    Current medicines are reviewed at length with the patient today.  The patient concerns regarding her medicines were addressed.  The following changes have been made:  No change  Labs/ tests ordered today include:   Orders Placed This Encounter  Procedures   EKG 12-Lead    Recommend 150 minutes/week of aerobic exercise Low fat, low carb, high fiber diet recommended  Disposition:   FU in 1 year   Signed, Lance Muss, MD  09/10/2023 10:57 AM    [facility] [address] [place], [place], [place]   [address] Phone: [phone]; Fax: [phone]

## 2023-09-10 NOTE — Patient Instructions (Addendum)
Medication Instructions:  Your physician recommends that you continue on your current medications as directed. Please refer to the Current Medication list given to you today.  *If you need a refill on your cardiac medications before your next appointment, please call your pharmacy*   Lab Work: none If you have labs (blood work) drawn today and your tests are completely normal, you will receive your results only by: MyChart Message (if you have MyChart) OR A paper copy in the mail If you have any lab test that is abnormal or we need to change your treatment, we will call you to review the results.   Testing/Procedures: Your physician has requested that you have an echocardiogram. Echocardiography is a painless test that uses sound waves to create images of your heart. It provides your doctor with information about the size and shape of your heart and how well your heart's chambers and valves are working. This procedure takes approximately one hour. There are no restrictions for this procedure. Please do NOT wear cologne, perfume, aftershave, or lotions (deodorant is allowed). Please arrive 15 minutes prior to your appointment time.  Your physician has requested that you have a carotid duplex. This test is an ultrasound of the carotid arteries in your neck. It looks at blood flow through these arteries that supply the brain with blood. Allow one hour for this exam. There are no restrictions or special instructions.    Follow-Up: At [facility], you and your health needs are our priority.  As part of our continuing mission to provide you with exceptional heart care, we have created designated Provider Care Teams.  These Care Teams include your primary Cardiologist (physician) and Advanced Practice Providers (APPs -  Physician Assistants and Nurse Practitioners) who all work together to provide you with the care you need, when you need it.  We recommend signing up for the patient  portal called "MyChart".  Sign up information is provided on this After Visit Summary.  MyChart is used to connect with patients for Virtual Visits (Telemedicine).  Patients are able to view lab/test results, encounter notes, upcoming appointments, etc.  Non-urgent messages can be sent to your provider as well.   To learn more about what you can do with MyChart, go to [email or web address].    Your next appointment:   6 month(s)  Provider:   Dr Lynnette Caffey   Other Instructions Check your medication bottles and call office to let us know if you are taking Atorvastatin or Rosuvastatin. Check your bottles and call office to let us know what dose of amlodipine, losartan and hydrochlorothiazide you are taking

## 2023-09-12 ENCOUNTER — Telehealth: Payer: Self-pay | Admitting: Interventional Cardiology

## 2023-09-12 NOTE — Telephone Encounter (Signed)
Have tried to reach patient twice today but both times received a fast busy signal

## 2023-09-12 NOTE — Telephone Encounter (Signed)
Tried again to reach patient but received fast busy signal--Med list updated

## 2023-09-12 NOTE — Telephone Encounter (Signed)
Patient calling back to give the prescription names she is taking.   Amlodipine 10mg  clopidogrel (PLAVIX) 75 MG tablet  hydrochlorothiazide (MICROZIDE) 12.5 MG capsule  (but its 25mg ) Lostarn 100mg   Rosuvastatin Calcium 20 MG CPSP  (take 3 times a week MWF)   Please advise

## 2023-09-14 NOTE — Telephone Encounter (Signed)
Phone continues to ring busy.

## 2023-09-17 NOTE — Telephone Encounter (Signed)
Phone continues to ring busy.  Call placed to other number listed for patient.  Voicemail indicates number is for Allison Schneider.  Left message to call office

## 2023-09-19 ENCOUNTER — Ambulatory Visit (HOSPITAL_COMMUNITY)
Admission: RE | Admit: 2023-09-19 | Discharge: 2023-09-19 | Disposition: A | Discharge status: Home / Self Care | Payer: Medicare Other | Source: Ambulatory Visit | Attending: Cardiology | Admitting: Cardiology

## 2023-09-19 DIAGNOSIS — I6523 Occlusion and stenosis of bilateral carotid arteries: Secondary | ICD-10-CM | POA: Diagnosis not present

## 2023-09-19 DIAGNOSIS — I350 Nonrheumatic aortic (valve) stenosis: Secondary | ICD-10-CM | POA: Diagnosis present

## 2023-09-20 ENCOUNTER — Ambulatory Visit (HOSPITAL_BASED_OUTPATIENT_CLINIC_OR_DEPARTMENT_OTHER): Payer: Medicare Other

## 2023-09-20 DIAGNOSIS — I350 Nonrheumatic aortic (valve) stenosis: Secondary | ICD-10-CM | POA: Diagnosis not present

## 2023-09-20 LAB — ECHOCARDIOGRAM COMPLETE
AR max vel: 0.77 cm2
AV Area VTI: 0.86 cm2
AV Area mean vel: 0.74 cm2
AV Mean grad: 27.2 mmHg
AV Peak grad: 47 mmHg
Ao pk vel: 3.43 m/s
Area-P 1/2: 2.95 cm2
Est EF: >75
S' Lateral: 2 cm

## 2023-09-25 NOTE — Telephone Encounter (Signed)
Tried again to reach patient.  Phone rings busy.  Message left on number listed as work number to call office

## 2023-09-26 ENCOUNTER — Other Ambulatory Visit: Payer: Self-pay | Admitting: *Deleted

## 2023-09-26 DIAGNOSIS — I6523 Occlusion and stenosis of bilateral carotid arteries: Secondary | ICD-10-CM

## 2023-10-05 NOTE — Telephone Encounter (Signed)
Patient viewed message sent to her through my chart

## 2023-10-12 ENCOUNTER — Ambulatory Visit: Payer: Self-pay | Admitting: Surgery

## 2023-10-18 ENCOUNTER — Ambulatory Visit: Payer: Medicare Other

## 2023-10-18 ENCOUNTER — Ambulatory Visit
Admission: RE | Admit: 2023-10-18 | Discharge: 2023-10-18 | Disposition: A | Discharge status: Home / Self Care | Payer: Medicare Other | Source: Ambulatory Visit | Attending: Family Medicine | Admitting: Family Medicine

## 2023-10-18 DIAGNOSIS — R928 Other abnormal and inconclusive findings on diagnostic imaging of breast: Secondary | ICD-10-CM

## 2024-01-30 ENCOUNTER — Encounter (HOSPITAL_COMMUNITY): Payer: Self-pay

## 2024-01-30 NOTE — Progress Notes (Signed)
 Surgical Instructions    Your procedure is scheduled on Wednesday, 02/06/24.  Report to [facility] Main Entrance A at 11 A.M., then check in with the Admitting office.  Call this number if you have problems the morning of surgery:  [phone]   If you have any questions prior to your surgery date call [phone]: Open Monday-Friday 8am-4pm If you experience any cold or flu symptoms such as cough, fever, chills, shortness of breath, etc. between now and your scheduled surgery, please notify us  at the above number     Remember:  Do not eat after midnight the night before your surgery- Tuesday.  You may drink clear liquids until 10 AM,  the morning of your surgery-Wednesday.   Clear liquids allowed are: Water, Non-Citrus Juices (without pulp), Carbonated Beverages, Clear Tea, Black Coffee ONLY (NO MILK, CREAM OR POWDERED CREAMER of any kind), and Gatorade    Take these medicines the morning of surgery with A SIP OF WATER:  amLODipine  (NORVASC )  rosuvastatin  (CRESTOR )   Hold Plavix  5 days prior to procedure.  Last dose will be on Thursday, 01/31/24.  As of today, STOP taking any Aspirin  (unless otherwise instructed by your surgeon) Aleve, Naproxen, Ibuprofen, Motrin, Advil, Goody's, BC's, all herbal medications, fish oil, and all vitamins.           Do not wear jewelry or makeup. Do not wear lotions, powders, perfumes or deodorant. Do not shave 48 hours prior to surgery.   Do not bring valuables to the hospital. Do not wear nail polish, gel polish, artificial nails, or any other type of covering on natural nails (fingers and toes) If you have artificial nails or gel coating that need to be removed by a nail salon, please have this removed prior to surgery. Artificial nails or gel coating may interfere with anesthesia's ability to adequately monitor your vital signs.   [facility] is not responsible for any belongings or valuables.    Do NOT Smoke (Tobacco/Vaping)  24 hours prior  to your procedure  Contacts, glasses, hearing aids, dentures or partials may not be worn into surgery, please bring cases for these belongings   Patients discharged the day of surgery will not be allowed to drive home, and someone needs to stay with them for 24 hours.   SURGICAL WAITING ROOM VISITATION Patients having surgery or a procedure may have no more than 2 support people in the waiting area - these visitors may rotate.   Children under the age of 50 must have an adult with them who is not the patient. If the patient needs to stay at the hospital during part of their recovery, the visitor guidelines for inpatient rooms apply. Pre-op nurse will coordinate an appropriate time for 1 support person to accompany patient in pre-op.  This support person may not rotate.   Please refer to [email or web address] for the visitor guidelines for Inpatients (after your surgery is over and you are in a regular room).    Special instructions:    Oral Hygiene is also important to reduce your risk of infection.  Remember - BRUSH YOUR TEETH THE MORNING OF SURGERY WITH YOUR REGULAR TOOTHPASTE   [facility]- Preparing For Surgery  Before surgery, you can play an important role. Because skin is not sterile, your skin needs to be as free of germs as possible. You can reduce the number of germs on your skin by washing with CHG (chlorahexidine gluconate) Soap before surgery.  CHG is an antiseptic cleaner which  kills germs and bonds with the skin to continue killing germs even after washing.     Please do not use if you have an allergy to CHG or antibacterial soaps. If your skin becomes reddened/irritated stop using the CHG.  Do not shave (including legs and underarms) for at least 48 hours prior to first CHG shower. It is OK to shave your face.  Please follow these instructions carefully.     Shower the [employer]. and the MORNING OF  SURGERY-Wed. with CHG Soap.   If you chose to wash your hair, wash your hair first as usual with your normal shampoo. After you shampoo, rinse your hair and body thoroughly to remove the shampoo.  Then [employer] and genitals (private parts) with your normal soap and rinse thoroughly to remove soap.  After that Use CHG Soap as you would any other liquid soap. You can apply CHG directly to the skin and wash gently with a scrungie or a clean washcloth.   Apply the CHG Soap to your body ONLY FROM THE NECK DOWN.  Do not use on open wounds or open sores. Avoid contact with your eyes, ears, mouth and genitals (private parts). Wash Face and genitals (private parts)  with your normal soap.   Wash thoroughly, paying special attention to the area where your surgery will be performed.  Thoroughly rinse your body with warm water from the neck down.  DO NOT shower/wash with your normal soap after using and rinsing off the CHG Soap.  Pat yourself dry with a CLEAN TOWEL.  Wear CLEAN PAJAMAS to bed the night before surgery  Place CLEAN SHEETS on your bed the night before your surgery  DO NOT SLEEP WITH PETS.   Day of Surgery:  Take a shower with CHG soap. Wear Clean/Comfortable clothing the morning of surgery Do not apply any deodorants/lotions.   Remember to brush your teeth WITH YOUR REGULAR TOOTHPASTE.    If you received a COVID test during your pre-op visit, it is requested that you wear a mask when out in public, stay away from anyone that may not be feeling well, and notify your surgeon if you develop symptoms. If you have been in contact with anyone that has tested positive in the last 10 days, please notify your surgeon.    Please read over the following fact sheets that you were given.

## 2024-01-31 ENCOUNTER — Telehealth: Payer: Self-pay

## 2024-01-31 ENCOUNTER — Other Ambulatory Visit: Payer: Self-pay

## 2024-01-31 ENCOUNTER — Encounter (HOSPITAL_COMMUNITY): Payer: Self-pay

## 2024-01-31 ENCOUNTER — Ambulatory Visit: Payer: Self-pay | Admitting: Surgery

## 2024-01-31 ENCOUNTER — Encounter (HOSPITAL_COMMUNITY)
Admission: RE | Admit: 2024-01-31 | Discharge: 2024-01-31 | Disposition: A | Discharge status: Home / Self Care | Payer: Medicare Other | Source: Ambulatory Visit | Attending: Surgery | Admitting: Surgery

## 2024-01-31 VITALS — BP 121/70 | HR 83 | Temp 98.3°F | Resp 18 | Ht 67.0 in | Wt 159.5 lb

## 2024-01-31 DIAGNOSIS — Z01812 Encounter for preprocedural laboratory examination: Secondary | ICD-10-CM | POA: Diagnosis present

## 2024-01-31 DIAGNOSIS — Z01818 Encounter for other preprocedural examination: Secondary | ICD-10-CM

## 2024-01-31 HISTORY — DX: Hyperlipidemia, unspecified: E78.5

## 2024-01-31 HISTORY — DX: Unspecified right bundle-branch block: I45.10

## 2024-01-31 HISTORY — DX: Unspecified osteoarthritis, unspecified site: M19.90

## 2024-01-31 HISTORY — DX: Nonrheumatic aortic (valve) stenosis: I35.0

## 2024-01-31 LAB — BASIC METABOLIC PANEL
Anion gap: 11 (ref 5–15)
BUN: 24 mg/dL — ABNORMAL HIGH (ref 8–23)
CO2: 30 mmol/L (ref 22–32)
Calcium: 10.2 mg/dL (ref 8.9–10.3)
Chloride: 102 mmol/L (ref 98–111)
Creatinine, Ser: 1.06 mg/dL — ABNORMAL HIGH (ref 0.44–1.00)
GFR, Estimated: 53 mL/min — ABNORMAL LOW (ref >60)
Glucose, Bld: 83 mg/dL (ref 70–99)
Potassium: 3.7 mmol/L (ref 3.5–5.1)
Sodium: 143 mmol/L (ref 135–145)

## 2024-01-31 LAB — CBC
HCT: 40.8 % (ref 36.0–46.0)
Hemoglobin: 13.2 g/dL (ref 12.0–15.0)
MCH: 26.1 pg (ref 26.0–34.0)
MCHC: 32.4 g/dL (ref 30.0–36.0)
MCV: 80.8 fL (ref 80.0–100.0)
Platelets: 204 10*3/uL (ref 150–400)
RBC: 5.05 MIL/uL (ref 3.87–5.11)
RDW: 15.6 % — ABNORMAL HIGH (ref 11.5–15.5)
WBC: 4.3 10*3/uL (ref 4.0–10.5)
nRBC: 0 % (ref 0.0–0.2)

## 2024-01-31 NOTE — Progress Notes (Signed)
 PCP - Bernardino Collier DO Cardiologist - Candyce Reek, MD   PPM/ICD - denies Device Orders - n/a Rep Notified - n/a  Chest x-ray -  EKG - 09-20-23 Stress Test -  ECHO - 09-20-23 Cardiac Cath -   Sleep Study - denies CPAP - n/a  DM -denies  Blood Thinner Instructions: Plavix  hold for 5 days prior to procedure per MD note 08-2023.  Encouraged patient to call cardiology to confirm instructions  Aspirin  Instructions: n/a  ERAS Protcol - clear liquids till 10:00   COVID TEST- n/a   Anesthesia review: yes  hx of stroke, htn  Patient denies shortness of breath, fever, cough and chest pain at PAT appointment   All instructions explained to the patient, with a verbal understanding of the material. Patient agrees to go over the instructions while at home for a better understanding. Patient also instructed to self quarantine after being tested for COVID-19. The opportunity to ask questions was provided.

## 2024-01-31 NOTE — Telephone Encounter (Signed)
 I called Dr. Verla office to s/w the surgery scheduler in regard to if previous clearance for Dr. Dasie was still good. I s/w Allison Schneider. Allison Schneider. I informed Allison due to time frame from original request we will need new request.   I was able to obtain all information while on the phone with Allison, Allison Schneider. I will enter and forward to preop for review. I did state the pt may need an appt due to time frame from original request.      Pre-operative Risk Assessment    Patient Name: Allison Schneider  DOB: Nov 01, 1946 MRN: [id number]   Date of last office visit: 09/10/23 DR. VARANASI Date of next office visit: NONE   Request for Surgical Clearance    Procedure:  HEPATIC CYSTS EXCISION  Date of Surgery:  Clearance TBD                                Surgeon:  DR. LEONOR DASIE Surgeon's Group or Practice Name:  [facility] Phone number:  [phone] Fax number:  [phone] Allison Schneider, Allison Schneider   Type of Clearance Requested:   - Medical  - Pharmacy:  Hold Clopidogrel  (Plavix )     Type of Anesthesia:  General    Additional requests/questions:    Allison Schneider   01/31/2024, 3:59 PM

## 2024-01-31 NOTE — Telephone Encounter (Signed)
 Dr Shelley Dew Allen's office told pt to call our office and make sure her pre-op clearance is still good since she did not have surgery done in October. She is now having it on 02/06/24. Please advise

## 2024-02-01 NOTE — Telephone Encounter (Signed)
   Name: Allison Schneider  DOB: 1946/07/20  MRN: [id number]  Primary Cardiologist: Candyce Reek, MD   Preoperative team, please contact this patient and set up a phone call appointment for further preoperative risk assessment. Please obtain consent and complete medication review. Thank you for your help.  I confirm that guidance regarding antiplatelet and oral anticoagulation therapy has been completed and, if necessary, noted below.  Patient Plavix  not prescribed by cardiology.  Recommendations for holding Plavix  will need to come from prescribing provider.  I also confirmed the patient resides in the state of [place] . As per [facility] Medical Board telemedicine laws, the patient must reside in the state in which the provider is licensed.   Allison CHRISTELLA Beauvais, NP 02/01/2024, 11:50 AM [facility]   \

## 2024-02-05 ENCOUNTER — Encounter (HOSPITAL_COMMUNITY): Payer: Self-pay

## 2024-02-05 NOTE — Telephone Encounter (Signed)
Tried to call the pt bu phone rings fast busy signal. Cannot leave message.

## 2024-02-05 NOTE — Anesthesia Preprocedure Evaluation (Signed)
Anesthesia Evaluation  Patient identified by MRN, date of birth, ID band Patient awake    Reviewed: Allergy & Precautions, H&P , NPO status , Patient's Chart, lab work & pertinent test results  Airway Mallampati: II  TM Distance: >3 FB Neck ROM: Full    Dental no notable dental hx.    Pulmonary neg pulmonary ROS   Pulmonary exam normal breath sounds clear to auscultation       Cardiovascular hypertension, Pt. on medications negative cardio ROS Normal cardiovascular exam+ Valvular Problems/Murmurs AS  Rhythm:Regular Rate:Normal     Neuro/Psych CVA  negative psych ROS   GI/Hepatic negative GI ROS, Neg liver ROS,,,  Endo/Other  negative endocrine ROS    Renal/GU negative Renal ROS  negative genitourinary   Musculoskeletal  (+) Arthritis , Osteoarthritis,    Abdominal   Peds negative pediatric ROS (+)  Hematology negative hematology ROS (+)   Anesthesia Other Findings   Reproductive/Obstetrics negative OB ROS                             Anesthesia Physical Anesthesia Plan  ASA: 3  Anesthesia Plan: General   Post-op Pain Management: Tylenol PO (pre-op)*   Induction: Intravenous  PONV Risk Score and Plan: 3 and Ondansetron, Dexamethasone, Midazolam and Treatment may vary due to age or medical condition  Airway Management Planned: Oral ETT  Additional Equipment:   Intra-op Plan:   Post-operative Plan: Extubation in OR  Informed Consent: I have reviewed the patients History and Physical, chart, labs and discussed the procedure including the risks, benefits and alternatives for the proposed anesthesia with the patient or authorized representative who has indicated his/her understanding and acceptance.     Dental advisory given  Plan Discussed with: CRNA  Anesthesia Plan Comments: (See PAT note from 2/6 by K Gekas PA-C )        Anesthesia Quick Evaluation

## 2024-02-05 NOTE — Progress Notes (Addendum)
 Case: [id number] Date/Time: 02/06/24 1245   Procedure: LAPAROSCOPIC FENESTRATION OF HEPATIC CYST   Anesthesia type: General   Pre-op diagnosis: LIVER CYST   Location: [facility] ROOM 02 / [facility] OR   Surgeons: Dasie Leonor CROME, MD       DISCUSSION: Allison Schneider is a 78 yo female who presents to PAT prior to surgery above. PMH of HTN, moderate AS, RBBB, carotid artery disease, CVA (in 2017), CKD, arthritis, obesity.  Patient follows with Cardiology for AS. Last echo on 09/20/23 showed normal EF, grade I DD, mod-severe AS (Aortic valve mean gradient measures 27.2 mmHg). Last seen on 09/10/23 by Dr. Dann for pre op clearance and cleared: Preoperative cardiovascular exam: Abdominal surgery with Dr. Leonor Dasie.  Repeat echo for AS.  Seems to have adequate exercise tolerance.  [place]  Walks [place], no cardiac sx.  I think she can have her surgery.  Could hold Plavix  5 days prior to surgery.  Dr. Dann regarding echo results: Normal LV function.  Moderate to severe aortic stenosis.  Okay for abdominal surgery as she is asymptomatic.  Watch for chest discomfort, fluid retention or syncope.  Will need cardiology follow-up in 6 months.  Noninvasive MD would be fine.  Updated cardiac clearance requested, not done as of 2/11 since cardiology was not able to get in touch with patient. Pt denied any symptoms at PAT visit. Anticipate ok to proceed.  Patient with hx of CVA in 2017. Had left-sided numbness, slurred speech, transient left facial droop.  MRI of the brain R lateral thalamic lacunar infarct secondary to small vessel disease source  MRA  Severe diffuse intracranial stenosis large vessels. Last seen by Neurology in 2018. Advised continue Plavix  for secondary stroke prevention. She was discharged from Neurology care.  LD Plavix : 2/6  VS: BP 121/70   Pulse 83   Temp 36.8 C (Oral)   Resp 18   Ht 5' 7 (1.702 m)   Wt 72.3 kg   SpO2 95%    BMI 24.98 kg/m   PROVIDERS: Dayna Motto, DO Cardiology: Dann  LABS: Labs reviewed: Acceptable for surgery. Kidney function stable (all labs ordered are listed, but only abnormal results are displayed)  Labs Reviewed  BASIC METABOLIC PANEL - Abnormal; Notable for the following components:      Result Value   BUN 24 (*)    Creatinine, Ser 1.06 (*)    GFR, Estimated 53 (*)    All other components within normal limits  CBC - Abnormal; Notable for the following components:   RDW 15.6 (*)    All other components within normal limits     IMAGES: MRI Abdomen 01/05/23: IMPRESSION: Very large simple appearing cystic lesion in the right hemiabdomen measuring up to 23 cm in cephalocaudal length. Based on overall appearance the simple lesion may be originating from the right hepatic lobe but based on size this does abut several structures and exact origin is difficult to confirm. Associated distortion of the liver with displacement of kidney, adrenal glands, pancreas and bowel. Also associated elevation of the right hemidiaphragm. Although this is simple due to its size and mass effect intervention may be considered. Recommend surgical consultation and correlate with specific symptoms.   Several Bosniak 1 renal cysts separately. No specific imaging follow-up recommended.   EKG 09/10/23:  Normal sinus rhythm, rate 69 Left axis deviation Right bundle branch block  CV:  Echo 09/20/23:  IMPRESSIONS  1. Large cystic liver mass noted (21 cm) - evaluated 12/2022 by MRI.  2. Left ventricular ejection fraction, by estimation, is >75%. The left ventricle has hyperdynamic function. The left ventricle has no regional wall motion abnormalities. There is mild left ventricular hypertrophy. Left ventricular diastolic parameters are consistent with Grade I diastolic dysfunction (impaired relaxation).  3. Right ventricular systolic function is normal. The right ventricular size is  normal. There is normal pulmonary artery systolic pressure. The estimated right ventricular systolic pressure is 24.7 mmHg.  4. The mitral valve is normal in structure. No evidence of mitral valve regurgitation. No evidence of mitral stenosis.  5. The aortic valve is normal in structure. There is severe calcifcation of the aortic valve. There is severe thickening of the aortic valve. Aortic valve regurgitation is not visualized. Moderate to severe aortic valve stenosis. Aortic valve area, by VTI measures 0.86 cm. Aortic valve mean gradient measures 27.2 mmHg. Aortic valve Vmax measures 3.43 m/s.  6. The inferior vena cava is normal in size with greater than 50% respiratory variability, suggesting right atrial pressure of 3 mmHg.  Comparison(s): Prior images reviewed side by side. Prior AV mean gradient 22 mmHg.   Carotid US  09/19/23:  Summary: Right Carotid: Velocities in the right ICA are consistent with a 40-59% stenosis. Non-hemodynamically significant plaque <50% noted in the CCA.  Left Carotid: Velocities in the left ICA are consistent with a 60-79% stenosis. Non-hemodynamically significant plaque <50% noted in the CCA. The ECA appears >50% stenosed.  Vertebrals:  Bilateral vertebral arteries demonstrate antegrade flow.  Subclavians: Normal flow hemodynamics were seen in bilateral subclavian arteries.   Past Medical History:  Diagnosis Date   Aortic stenosis    Arthritis    Hepatic cyst 01/23/2023   in [place]   HLD (hyperlipidemia)    Hypertension    Obesity    RBBB    Stroke (HCC)    ischemic stroke    Past Surgical History:  Procedure Laterality Date   BREAST BIOPSY      MEDICATIONS:  amLODipine  (NORVASC ) 10 MG tablet   Bioflavonoid Products (VITAMIN C) CHEW   BIOTIN PO   Cholecalciferol (VITAMIN D-3 PO)   clopidogrel  (PLAVIX ) 75 MG tablet   Coenzyme Q10 (COQ10 PO)   Cyanocobalamin (VITAMIN B-12 PO)   diclofenac Sodium (VOLTAREN) 1 % GEL   Homeopathic  Products (ARNICARE EX)   hydrochlorothiazide  (HYDRODIURIL ) 25 MG tablet   Menthol, Topical Analgesic, (BIOFREEZE EX)   Multiple Vitamin (MULTIVITAMIN WITH MINERALS) TABS tablet   Multiple Vitamins-Minerals (ZINC PO)   rosuvastatin  (CRESTOR ) 20 MG tablet   VITAMIN E PO   No current facility-administered medications for this encounter.   Burnard CHRISTELLA Odis DEVONNA [facility] Short Stay/Anesthesiology [facility] Phone [phone] 02/05/2024 12:43 PM

## 2024-02-06 ENCOUNTER — Ambulatory Visit (HOSPITAL_BASED_OUTPATIENT_CLINIC_OR_DEPARTMENT_OTHER): Payer: Medicare Other | Admitting: Anesthesiology

## 2024-02-06 ENCOUNTER — Other Ambulatory Visit: Payer: Self-pay

## 2024-02-06 ENCOUNTER — Encounter (HOSPITAL_COMMUNITY): Payer: Self-pay | Admitting: Surgery

## 2024-02-06 ENCOUNTER — Ambulatory Visit (HOSPITAL_COMMUNITY): Payer: Medicare Other | Admitting: Physician Assistant

## 2024-02-06 ENCOUNTER — Ambulatory Visit (HOSPITAL_COMMUNITY)
Admission: RE | Admit: 2024-02-06 | Discharge: 2024-02-07 | Disposition: A | Discharge status: Home / Self Care | Payer: Medicare Other | Attending: Surgery | Admitting: Surgery

## 2024-02-06 ENCOUNTER — Encounter (HOSPITAL_COMMUNITY)
Admission: RE | Disposition: A | Discharge status: Home / Self Care | Payer: Self-pay | Source: Home / Self Care | Attending: Surgery

## 2024-02-06 DIAGNOSIS — I10 Essential (primary) hypertension: Secondary | ICD-10-CM | POA: Insufficient documentation

## 2024-02-06 DIAGNOSIS — Z8673 Personal history of transient ischemic attack (TIA), and cerebral infarction without residual deficits: Secondary | ICD-10-CM | POA: Insufficient documentation

## 2024-02-06 DIAGNOSIS — K7689 Other specified diseases of liver: Secondary | ICD-10-CM | POA: Diagnosis present

## 2024-02-06 DIAGNOSIS — I350 Nonrheumatic aortic (valve) stenosis: Secondary | ICD-10-CM | POA: Insufficient documentation

## 2024-02-06 DIAGNOSIS — I679 Cerebrovascular disease, unspecified: Secondary | ICD-10-CM | POA: Diagnosis not present

## 2024-02-06 DIAGNOSIS — Z7902 Long term (current) use of antithrombotics/antiplatelets: Secondary | ICD-10-CM | POA: Diagnosis not present

## 2024-02-06 HISTORY — PX: LAPAROSCOPIC LIVER CYST REMOVAL: SHX5900

## 2024-02-06 SURGERY — EXCISION, CYST, LIVER, LAPAROSCOPIC
Anesthesia: General | Site: Abdomen

## 2024-02-06 MED ORDER — DEXMEDETOMIDINE HCL IN NACL 200 MCG/50ML IV SOLN
INTRAVENOUS | Status: DC | PRN
  Administered 2024-02-06 (×2): 8 ug via INTRAVENOUS
  Administered 2024-02-06: 4 ug via INTRAVENOUS

## 2024-02-06 MED ORDER — AMISULPRIDE (ANTIEMETIC) 5 MG/2ML IV SOLN: 10.0000 mg | Freq: Once | INTRAVENOUS | Status: DC | PRN

## 2024-02-06 MED ORDER — OXYCODONE HCL 5 MG/5ML PO SOLN: 5.0000 mg | Freq: Once | ORAL | Status: DC | PRN

## 2024-02-06 MED ORDER — LIDOCAINE 2% (20 MG/ML) 5 ML SYRINGE
INTRAMUSCULAR | Status: AC
  Filled 2024-02-06: qty 5

## 2024-02-06 MED ORDER — FENTANYL CITRATE (PF) 250 MCG/5ML IJ SOLN
INTRAMUSCULAR | Status: DC | PRN
  Administered 2024-02-06 (×2): 50 ug via INTRAVENOUS
  Administered 2024-02-06: 100 ug via INTRAVENOUS

## 2024-02-06 MED ORDER — DEXAMETHASONE SODIUM PHOSPHATE 10 MG/ML IJ SOLN
INTRAMUSCULAR | Status: DC | PRN
  Administered 2024-02-06: 10 mg via INTRAVENOUS

## 2024-02-06 MED ORDER — SUGAMMADEX SODIUM 200 MG/2ML IV SOLN
INTRAVENOUS | Status: DC | PRN
  Administered 2024-02-06: 200 mg via INTRAVENOUS

## 2024-02-06 MED ORDER — LIDOCAINE 2% (20 MG/ML) 5 ML SYRINGE
INTRAMUSCULAR | Status: DC | PRN
  Administered 2024-02-06: 60 mg via INTRAVENOUS

## 2024-02-06 MED ORDER — ROCURONIUM BROMIDE 10 MG/ML (PF) SYRINGE
PREFILLED_SYRINGE | INTRAVENOUS | Status: DC | PRN
  Administered 2024-02-06: 50 mg via INTRAVENOUS

## 2024-02-06 MED ORDER — METHOCARBAMOL 500 MG PO TABS
500.0000 mg | ORAL_TABLET | Freq: Four times a day (QID) | ORAL | Status: DC | PRN
  Administered 2024-02-06: 500 mg via ORAL
  Filled 2024-02-06: qty 1

## 2024-02-06 MED ORDER — POLYETHYLENE GLYCOL 3350 17 G PO PACK: 17.0000 g | PACK | Freq: Every day | ORAL | Status: DC | PRN

## 2024-02-06 MED ORDER — ONDANSETRON HCL 4 MG/2ML IJ SOLN
INTRAMUSCULAR | Status: DC | PRN
  Administered 2024-02-06: 4 mg via INTRAVENOUS

## 2024-02-06 MED ORDER — HYDROMORPHONE HCL 1 MG/ML IJ SOLN: 0.2500 mg | INTRAMUSCULAR | Status: DC | PRN

## 2024-02-06 MED ORDER — DIPHENHYDRAMINE HCL 12.5 MG/5ML PO ELIX: 12.5000 mg | ORAL_SOLUTION | Freq: Four times a day (QID) | ORAL | Status: DC | PRN

## 2024-02-06 MED ORDER — ACETAMINOPHEN 500 MG PO TABS
1000.0000 mg | ORAL_TABLET | Freq: Three times a day (TID) | ORAL | Status: DC
  Administered 2024-02-06 (×2): 1000 mg via ORAL
  Filled 2024-02-06 (×2): qty 2

## 2024-02-06 MED ORDER — HYDROCHLOROTHIAZIDE 25 MG PO TABS: 25.0000 mg | ORAL_TABLET | Freq: Every morning | ORAL | Status: DC

## 2024-02-06 MED ORDER — FENTANYL CITRATE (PF) 250 MCG/5ML IJ SOLN
INTRAMUSCULAR | Status: AC
  Filled 2024-02-06: qty 5

## 2024-02-06 MED ORDER — ONDANSETRON HCL 4 MG/2ML IJ SOLN
INTRAMUSCULAR | Status: AC
  Filled 2024-02-06: qty 2

## 2024-02-06 MED ORDER — ACETAMINOPHEN 500 MG PO TABS: 1000.0000 mg | ORAL_TABLET | ORAL | Status: AC

## 2024-02-06 MED ORDER — ENOXAPARIN SODIUM 40 MG/0.4ML IJ SOSY: 40.0000 mg | PREFILLED_SYRINGE | INTRAMUSCULAR | Status: DC

## 2024-02-06 MED ORDER — ROCURONIUM BROMIDE 10 MG/ML (PF) SYRINGE
PREFILLED_SYRINGE | INTRAVENOUS | Status: AC
  Filled 2024-02-06: qty 10

## 2024-02-06 MED ORDER — ONDANSETRON HCL 4 MG/2ML IJ SOLN: 4.0000 mg | Freq: Four times a day (QID) | INTRAMUSCULAR | Status: DC | PRN

## 2024-02-06 MED ORDER — ORAL CARE MOUTH RINSE: 15.0000 mL | Freq: Once | OROMUCOSAL | Status: AC

## 2024-02-06 MED ORDER — ACETAMINOPHEN 500 MG PO TABS
ORAL_TABLET | ORAL | Status: AC
Start: 2024-02-06 — End: 2024-02-06
  Administered 2024-02-06: 1000 mg via ORAL
  Filled 2024-02-06: qty 2

## 2024-02-06 MED ORDER — PROPOFOL 10 MG/ML IV BOLUS
INTRAVENOUS | Status: AC
  Filled 2024-02-06: qty 20

## 2024-02-06 MED ORDER — TRAMADOL HCL 50 MG PO TABS: 50.0000 mg | ORAL_TABLET | Freq: Four times a day (QID) | ORAL | Status: DC | PRN

## 2024-02-06 MED ORDER — CEFAZOLIN SODIUM-DEXTROSE 2-4 GM/100ML-% IV SOLN
INTRAVENOUS | Status: AC
Start: 2024-02-06 — End: 2024-02-06
  Filled 2024-02-06: qty 100

## 2024-02-06 MED ORDER — BUPIVACAINE-EPINEPHRINE (PF) 0.25% -1:200000 IJ SOLN
INTRAMUSCULAR | Status: AC
  Filled 2024-02-06: qty 30

## 2024-02-06 MED ORDER — PROPOFOL 10 MG/ML IV BOLUS
INTRAVENOUS | Status: DC | PRN
  Administered 2024-02-06: 130 mg via INTRAVENOUS

## 2024-02-06 MED ORDER — OXYCODONE HCL 5 MG PO TABS: 5.0000 mg | ORAL_TABLET | Freq: Once | ORAL | Status: DC | PRN

## 2024-02-06 MED ORDER — AMLODIPINE BESYLATE 10 MG PO TABS
10.0000 mg | ORAL_TABLET | Freq: Every morning | ORAL | Status: DC
  Administered 2024-02-07: 10 mg via ORAL
  Filled 2024-02-06: qty 1

## 2024-02-06 MED ORDER — SODIUM CHLORIDE 0.9 % IV SOLN: 12.5000 mg | INTRAVENOUS | Status: DC | PRN

## 2024-02-06 MED ORDER — CHLORHEXIDINE GLUCONATE 0.12 % MT SOLN: 15.0000 mL | Freq: Once | OROMUCOSAL | Status: AC

## 2024-02-06 MED ORDER — MORPHINE SULFATE (PF) 2 MG/ML IV SOLN: 2.0000 mg | INTRAVENOUS | Status: DC | PRN

## 2024-02-06 MED ORDER — CHLORHEXIDINE GLUCONATE 0.12 % MT SOLN
OROMUCOSAL | Status: AC
Start: 2024-02-06 — End: 2024-02-06
  Administered 2024-02-06: 15 mL via OROMUCOSAL
  Filled 2024-02-06: qty 15

## 2024-02-06 MED ORDER — CEFAZOLIN SODIUM-DEXTROSE 2-4 GM/100ML-% IV SOLN
2.0000 g | INTRAVENOUS | Status: AC
  Administered 2024-02-06: 2 g via INTRAVENOUS

## 2024-02-06 MED ORDER — DEXAMETHASONE SODIUM PHOSPHATE 10 MG/ML IJ SOLN
INTRAMUSCULAR | Status: AC
  Filled 2024-02-06: qty 1

## 2024-02-06 MED ORDER — LACTATED RINGERS IV SOLN: INTRAVENOUS | Status: DC | PRN

## 2024-02-06 MED ORDER — 0.9 % SODIUM CHLORIDE (POUR BTL) OPTIME
TOPICAL | Status: DC | PRN
  Administered 2024-02-06: 1000 mL

## 2024-02-06 MED ORDER — ADULT MULTIVITAMIN W/MINERALS CH
1.0000 | ORAL_TABLET | Freq: Every morning | ORAL | Status: DC
  Administered 2024-02-07: 1 via ORAL
  Filled 2024-02-06: qty 1

## 2024-02-06 MED ORDER — DIPHENHYDRAMINE HCL 50 MG/ML IJ SOLN: 12.5000 mg | Freq: Four times a day (QID) | INTRAMUSCULAR | Status: DC | PRN

## 2024-02-06 MED ORDER — BUPIVACAINE-EPINEPHRINE 0.25% -1:200000 IJ SOLN
INTRAMUSCULAR | Status: DC | PRN
  Administered 2024-02-06: 30 mL

## 2024-02-06 MED ORDER — SODIUM CHLORIDE 0.9 % IR SOLN
Status: DC | PRN
  Administered 2024-02-06: 1000 mL

## 2024-02-06 MED ORDER — ALBUMIN HUMAN 5 % IV SOLN: INTRAVENOUS | Status: DC | PRN

## 2024-02-06 MED ORDER — DOCUSATE SODIUM 100 MG PO CAPS
100.0000 mg | ORAL_CAPSULE | Freq: Two times a day (BID) | ORAL | Status: DC
  Administered 2024-02-06: 100 mg via ORAL
  Filled 2024-02-06: qty 1

## 2024-02-06 MED ORDER — ONDANSETRON 4 MG PO TBDP
4.0000 mg | ORAL_TABLET | Freq: Four times a day (QID) | ORAL | Status: DC | PRN
Start: 2024-02-06 — End: 2024-02-07

## 2024-02-06 MED ORDER — ROSUVASTATIN CALCIUM 20 MG PO TABS: 20.0000 mg | ORAL_TABLET | ORAL | Status: DC

## 2024-02-06 SURGICAL SUPPLY — 75 items
CANISTER SUCT 3000ML PPV (MISCELLANEOUS) IMPLANT
CHLORAPREP W/TINT 26 (MISCELLANEOUS) ×1 IMPLANT
COVER SURGICAL LIGHT HANDLE (MISCELLANEOUS) ×1 IMPLANT
DERMABOND ADVANCED .7 DNX12 (GAUZE/BANDAGES/DRESSINGS) ×1 IMPLANT
DRAIN CHANNEL 19F RND (DRAIN) IMPLANT
DRAPE INCISE IOBAN 66X45 STRL (DRAPES) ×1 IMPLANT
DRAPE WARM FLUID 44X44 (DRAPES) ×1 IMPLANT
ELECT BLADE 6.5 EXT (BLADE) IMPLANT
ELECT CAUTERY BLADE 6.4 (BLADE) ×1 IMPLANT
ELECT PAD DSPR THERM+ ADLT (MISCELLANEOUS) ×1 IMPLANT
ELECT REM PT RETURN 9FT ADLT (ELECTROSURGICAL) ×1 IMPLANT
ELECTRODE REM PT RTRN 9FT ADLT (ELECTROSURGICAL) ×1 IMPLANT
EVACUATOR SILICONE 100CC (DRAIN) IMPLANT
GLOVE BIOGEL PI IND STRL 6 (GLOVE) ×1 IMPLANT
GLOVE SURG POLY MICRO LF SZ5.5 (GLOVE) ×1 IMPLANT
GOWN STRL REUS W/ TWL LRG LVL3 (GOWN DISPOSABLE) ×3 IMPLANT
GRASPER SUT TROCAR 14GX15 (MISCELLANEOUS) IMPLANT
HEMOSTAT SNOW SURGICEL 2X4 (HEMOSTASIS) ×1 IMPLANT
IRRIG SUCT STRYKERFLOW 2 WTIP (MISCELLANEOUS) ×1 IMPLANT
IRRIGATION SUCT STRKRFLW 2 WTP (MISCELLANEOUS) ×1 IMPLANT
KIT BASIN OR (CUSTOM PROCEDURE TRAY) ×1 IMPLANT
KIT TURNOVER KIT B (KITS) ×1 IMPLANT
L-HOOK LAP DISP 36CM (ELECTROSURGICAL) ×1 IMPLANT
LHOOK LAP DISP 36CM (ELECTROSURGICAL) ×1 IMPLANT
LOOP VASCLR MAXI BLUE 18IN ST (MISCELLANEOUS) IMPLANT
NDL INSUFFLATION 14GA 120MM (NEEDLE) IMPLANT
NEEDLE INSUFFLATION 14GA 120MM (NEEDLE) ×1 IMPLANT
NS IRRIG 1000ML POUR BTL (IV SOLUTION) ×2 IMPLANT
PAD ARMBOARD 7.5X6 YLW CONV (MISCELLANEOUS) ×2 IMPLANT
PENCIL BUTTON HOLSTER BLD 10FT (ELECTRODE) ×1 IMPLANT
POUCH LAPAROSCOPIC INSTRUMENT (MISCELLANEOUS) ×1 IMPLANT
PROBE LAPAROSCOPIC 5MM W/FTSWT (MISCELLANEOUS) ×1 IMPLANT
RELOAD STAPLE 60 2.6 WHT THN (STAPLE) IMPLANT
RELOAD STAPLE 60 3.6 BLU REG (STAPLE) IMPLANT
SEALER BIPOLAR AQUA END DBS8.7 (INSTRUMENTS) ×1 IMPLANT
SET TUBE SMOKE EVAC HIGH FLOW (TUBING) ×1 IMPLANT
SHEARS HARMONIC ACE PLUS 36CM (ENDOMECHANICALS) ×1 IMPLANT
SLEEVE Z-THREAD 5X100MM (TROCAR) ×1 IMPLANT
SPONGE T-LAP 18X18 ~~LOC~~+RFID (SPONGE) IMPLANT
STAPLE ECHEON FLEX 60 POW ENDO (STAPLE) IMPLANT
STAPLER RELOAD BLUE 60MM (STAPLE) IMPLANT
STAPLER RELOAD WHITE 60MM (STAPLE) IMPLANT
STAPLER VISISTAT 35W (STAPLE) ×1 IMPLANT
SUT ETHILON 1 LR 30 (SUTURE) IMPLANT
SUT ETHILON 2 0 FS 18 (SUTURE) IMPLANT
SUT MNCRL AB 4-0 PS2 18 (SUTURE) ×1 IMPLANT
SUT PDS AB 1 TP1 96 (SUTURE) IMPLANT
SUT PDS II 0 TP-1 LOOPED 60 (SUTURE) IMPLANT
SUT PROLENE 3 0 SH 48 (SUTURE) ×1 IMPLANT
SUT PROLENE 3 0 SH DA (SUTURE) ×1 IMPLANT
SUT PROLENE 4-0 RB1 .5 CRCL 36 (SUTURE) ×1 IMPLANT
SUT PROLENE 4-0 RB1 18X2 ARM (SUTURE) ×1 IMPLANT
SUT VIC AB 2-0 CT1 TAPERPNT 27 (SUTURE) IMPLANT
SUT VIC AB 2-0 SH 18 (SUTURE) IMPLANT
SUT VIC AB 3-0 SH 18 (SUTURE) IMPLANT
SUT VICRYL 0 UR6 27IN ABS (SUTURE) IMPLANT
SUT VICRYL AB 2 0 TIES (SUTURE) IMPLANT
SUT VICRYL AB 3 0 TIES (SUTURE) IMPLANT
SYS BAG RETRIEVAL 10MM (BASKET) IMPLANT
SYS LAPSCP GELPORT 120MM (MISCELLANEOUS) IMPLANT
SYSTEM BAG RETRIEVAL 10MM (BASKET) ×1 IMPLANT
SYSTEM LAPSCP GELPORT 120MM (MISCELLANEOUS) IMPLANT
TIE VASCULAR MAXI BLUE 18IN ST (MISCELLANEOUS) IMPLANT
TOWEL GREEN STERILE (TOWEL DISPOSABLE) ×1 IMPLANT
TOWEL GREEN STERILE FF (TOWEL DISPOSABLE) ×1 IMPLANT
TRAY FOLEY MTR SLVR 16FR STAT (SET/KITS/TRAYS/PACK) IMPLANT
TRAY LAPAROSCOPIC MC (CUSTOM PROCEDURE TRAY) ×1 IMPLANT
TROCAR BALLN 12MMX100 BLUNT (TROCAR) ×1 IMPLANT
TROCAR Z THREAD OPTICAL 12X100 (TROCAR) IMPLANT
TROCAR Z-THREAD OPTICAL 5X100M (TROCAR) ×1 IMPLANT
TUBE CONNECTING 12X1/4 (SUCTIONS) IMPLANT
VASCULAR TIE MAXI BLUE 18IN ST (MISCELLANEOUS)
WARMER LAPAROSCOPE (MISCELLANEOUS) ×1 IMPLANT
WATER STERILE IRR 1000ML POUR (IV SOLUTION) ×1 IMPLANT
YANKAUER SUCT BULB TIP NO VENT (SUCTIONS) IMPLANT

## 2024-02-06 NOTE — Transfer of Care (Signed)
Immediate Anesthesia Transfer of Care Note  Patient: Samanda Madding  Procedure(s) Performed: LAPAROSCOPIC FENESTRATION OF HEPATIC CYST (Abdomen)  Patient Location: PACU  Anesthesia Type:General  Level of Consciousness: awake and patient cooperative  Airway & Oxygen Therapy: Patient Spontanous Breathing and Patient connected to nasal cannula oxygen  Post-op Assessment: Report given to RN and Post -op Vital signs reviewed and stable  Post vital signs: Reviewed and stable  Last Vitals:  Vitals Value Taken Time  BP 142/86 02/06/24 1445  Temp    Pulse 80 02/06/24 1452  Resp 9 02/06/24 1452  SpO2 98 % 02/06/24 1452  Vitals shown include unfiled device data.  Last Pain:  Vitals:   02/06/24 1445  PainSc: 0-No pain         Complications: No notable events documented.

## 2024-02-06 NOTE — Op Note (Signed)
Date: 02/06/24  Patient: Allison Schneider MRN: [id number]  Preoperative Diagnosis: Simple hepatic cyst of the right lobe of the liver Postoperative Diagnosis: Same  Procedure: Laparoscopic fenestration of hepatic cyst  Surgeon: Sophronia Simas, MD Assistant: Berenda Morale, RNFA  EBL: Minimal  Anesthesia: General endotracheal  Specimens: Liver cyst wall  Indications: [name] is a 78 yo female who was referred with a very large simple cyst of the right lobe of the liver. This appeared to be causing mass effect on imaging and was symptomatic. After a discussion of the risks and benefits of surgery, she agreed to proceed with laparoscopic fenestration.  Findings: Massive cyst in the right liver containing clear colorless fluid and no septations, consistent with a benign simple cyst. The cyst was unroofed and the lining was ablated.  Procedure details: Informed consent was obtained in the preoperative area prior to the procedure. The patient was brought to the operating room and placed on the table in the supine position. General anesthesia was induced and appropriate lines and drains were placed for intraoperative monitoring. Perioperative antibiotics were administered per SCIP guidelines. The abdomen was prepped and draped in the usual sterile fashion. A pre-procedure timeout was taken verifying patient identity, surgical site and procedure to be performed.  A small skin incision was made in the left upper quadrant at Palmer's point, the fascia was grasped and elevated, and a Veress needle was inserted through the fascia.  Intraperitoneal placement was confirmed with the saline drop test and the abdomen was insufflated.  A 5 mm Visiport was placed, and the abdomen was inspected.  There were some loops of small bowel adherent to the lower midline abdominal wall at the site of the previous surgical scar, but the upper abdomen was clear.  There was a small superficial laceration on the left  lateral surface of the liver from entry into the abdomen, approximately 1cm in length.  An additional 5 mm port was placed in the right periumbilical area, followed by a port in the right lateral costal margin.  The periumbilical port was then upsized to a 12 mm port, and a 5 mm port was placed in the subxiphoid area.  Hemostasis was achieved at the liver laceration using Aquamantys.   There was a very large cyst in the right posterolateral liver, displacing the remainder of the liver medially.  The gallbladder was adjacent to but not involved by the cyst.  The wall of the cyst was opened using harmonic shears, and a total of approximately 4 L of clear colorless fluid was evacuated from the cyst.  The cyst was then unroofed using a harmonic.  The wall was separated from the peritoneal lining pf the abdomen laterally using blunt dissection and harmonic shears.  Care was taken not to enter the liver parenchyma at the border of the cyst superiorly.  The cyst wall was further separated from the right lateral abdominal wall and retroperitoneum.  The cyst wall was excised, placed in an Endo Catch bag, and extracted via the 12 mm port.  The wall was sent for routine pathology.  The remaining lining of the cyst was then ablated using Aquamantys.  The surgical site was inspected and appeared hemostatic.  The hepatic flexure of the colon was normal in appearance with no signs of injury. The left lateral liver was examined and there was no bleeding at the site of the laceration.  The 12 mm port site fascia was closed with 0 Vicryl figure-of-eight suture using a PMI.  The remaining ports were removed under direct visualization and the abdomen was desufflated.  The skin at each port site was closed with subcuticular 4-0 Monocryl suture.  Dermabond was applied.  The patient tolerated the procedure well with no apparent complications.  All counts were correct x2 at the end of the procedure. The patient was extubated and taken  to PACU in stable condition.  Sophronia Simas, MD 02/06/24 2:40 PM

## 2024-02-06 NOTE — H&P (Signed)
Allison Schneider is an 78 y.o. female.   Chief Complaint: liver cyst HPI: Allison Schneider is a 78 yo female with a large simple cyst in the right lobe of the liver. She last saw me in August. She has not had any recent changes in symptoms. She had an echo in September which showed normal LV function and moderate to severe aortic stenosis, which is asymptomatic. No further workup was recommended prior to surgery. Her plavix has been on hold for 6 days (she is on this for a history of CVA).   Past Medical History:  Diagnosis Date   Aortic stenosis    Arthritis    Hepatic cyst 01/23/2023   in [place]   HLD (hyperlipidemia)    Hypertension    Obesity    RBBB    Stroke [facility])    ischemic stroke    Past Surgical History:  Procedure Laterality Date   BREAST BIOPSY      Family History  Problem Relation Age of Onset   CAD Mother        MI   Breast cancer Sister    Social History:  reports that she has never smoked. She has never used smokeless tobacco. She reports that she does not drink alcohol and does not use drugs.  Allergies: No Known Allergies  Medications Prior to Admission  Medication Sig Dispense Refill   amLODipine (NORVASC) 10 MG tablet Take 10 mg by mouth in the morning.     Bioflavonoid Products (VITAMIN C) CHEW Chew 2 tablets by mouth 3 (three) times a week. Gummies     BIOTIN PO Take 1 tablet by mouth in the morning.     Cholecalciferol (VITAMIN D-3 PO) Take 1 tablet by mouth in the morning.     clopidogrel (PLAVIX) 75 MG tablet Take 1 tablet (75 mg total) by mouth daily. 30 tablet 1   Coenzyme Q10 (COQ10 PO) Take 1 tablet by mouth in the morning.     Cyanocobalamin (VITAMIN B-12 PO) Take 1 tablet by mouth in the morning.     diclofenac Sodium (VOLTAREN) 1 % GEL Apply 1 Application topically 4 (four) times daily as needed (pain.).     Homeopathic Products (ARNICARE EX) Apply 1 Application topically as needed (pain.).     hydrochlorothiazide (HYDRODIURIL) 25 MG tablet Take 25 mg  by mouth every morning.     Menthol, Topical Analgesic, (BIOFREEZE EX) Apply 1 Application topically as needed (pain).     Multiple Vitamin (MULTIVITAMIN WITH MINERALS) TABS tablet Take 1 tablet by mouth in the morning. Women 50+     Multiple Vitamins-Minerals (ZINC PO) Take 1 tablet by mouth in the morning.     rosuvastatin (CRESTOR) 20 MG tablet Take 20 mg by mouth every Monday, Wednesday, and Friday.     VITAMIN E PO Take 1 capsule by mouth in the morning.      No results found for this or any previous visit (from the past 48 hours). No results found.  Review of Systems  Blood pressure (!) 148/61, pulse 73, temperature 98 F (36.7 C), resp. rate 18, height 5\' 7"  (1.702 m), weight 72.3 kg, SpO2 94%. Physical Exam Vitals reviewed.  Constitutional:      General: She is not in acute distress.    Appearance: Normal appearance.  HENT:     Head: Normocephalic and atraumatic.  Pulmonary:     Effort: Pulmonary effort is normal. No respiratory distress.  Abdominal:     General: There is  no distension.     Palpations: Abdomen is soft.     Tenderness: There is no abdominal tenderness.     Comments: Well-healed lower midline scar.  Skin:    General: Skin is warm and dry.  Neurological:     General: No focal deficit present.     Mental Status: She is alert and oriented to person, place, and time.      Assessment/Plan 78 yo female with a large symptomatic right liver cyst. Proceed to the OR for laparoscopic fenestration. The procedure details were reviewed and informed consent was obtained. She will be admitted for overnight observation. She may resume her Plavix in 24 hours. All questions were answered.  Fritzi Mandes, MD 02/06/2024, 12:38 PM

## 2024-02-06 NOTE — Anesthesia Procedure Notes (Addendum)
Procedure Name: Intubation Date/Time: 02/06/2024 1:17 PM  Performed by: Lowella Curb, MDPre-anesthesia Checklist: Patient identified, Emergency Drugs available, Suction available and Patient being monitored Patient Re-evaluated:Patient Re-evaluated prior to induction Oxygen Delivery Method: Circle system utilized Preoxygenation: Pre-oxygenation with 100% oxygen Induction Type: IV induction Ventilation: Mask ventilation without difficulty Laryngoscope Size: Mac and 3 Grade View: Grade I Tube type: Oral Tube size: 7.0 mm Number of attempts: 1 Airway Equipment and Method: Stylet and Oral airway Placement Confirmation: ETT inserted through vocal cords under direct vision, positive ETCO2 and breath sounds checked- equal and bilateral Secured at: 21 cm Tube secured with: Tape Dental Injury: Teeth and Oropharynx as per pre-operative assessment

## 2024-02-07 ENCOUNTER — Encounter (HOSPITAL_COMMUNITY): Payer: Self-pay | Admitting: Surgery

## 2024-02-07 DIAGNOSIS — K7689 Other specified diseases of liver: Secondary | ICD-10-CM | POA: Diagnosis not present

## 2024-02-07 MED ORDER — ACETAMINOPHEN 500 MG PO TABS: 1000.0000 mg | ORAL_TABLET | Freq: Three times a day (TID) | ORAL | Status: AC | PRN

## 2024-02-07 MED ORDER — TRAMADOL HCL 50 MG PO TABS: 50.0000 mg | ORAL_TABLET | Freq: Four times a day (QID) | ORAL | 0 refills | Status: AC | PRN

## 2024-02-07 MED ORDER — POLYETHYLENE GLYCOL 3350 17 G PO PACK: 17.0000 g | PACK | Freq: Every day | ORAL | Status: AC | PRN

## 2024-02-07 MED ORDER — DOCUSATE SODIUM 100 MG PO CAPS: 100.0000 mg | ORAL_CAPSULE | Freq: Two times a day (BID) | ORAL | 0 refills | Status: AC | PRN

## 2024-02-07 NOTE — Progress Notes (Signed)
Patient is doing well this morning. Mild incisional pain is well-controlled. She tolerated dinner last night and denies nausea. No acute complaints. Will discharge home this morning. Discharge instructions were reviewed and follow up appointment confirmed. All questions were answered.

## 2024-02-07 NOTE — Discharge Instructions (Addendum)
   [facility] DISCHARGE INSTRUCTIONS  Activity No heavy lifting greater than 15 pounds for 4 weeks after surgery. Ok to shower in 24 hours, but do not bathe or submerge incisions underwater. Do not drive while taking narcotic pain medication. You may drive when you are no longer taking prescription pain medication, you can comfortably wear a seatbelt, and you can safely maneuver your car and apply brakes.  Wound Care Your incisions are covered with skin glue called Dermabond. This will peel off on its own over time. You may shower and allow warm soapy water to run over your incisions. Gently pat dry. Do not submerge your incision underwater until cleared by your surgeon. Monitor your incision for any new redness, tenderness, or drainage. Many patients will experience some swelling and bruising at the incisions.  Ice packs will help.  Swelling and bruising can take several days to resolve.   Medications A  prescription for pain medication may be given to you upon discharge.  Take your pain medication as prescribed, if needed.  If narcotic pain medicine is not needed, then you may take acetaminophen (Tylenol) or ibuprofen (Advil) as needed. It is common to experience some constipation if taking pain medication after surgery.  Increasing fluid intake and taking a stool softener (such as Colace) will usually help or prevent this problem from occurring.  A mild laxative (Milk of Magnesia or Miralax) should be taken according to package directions if there are no bowel movements after 48 hours. Take your usually prescribed medications unless otherwise directed. If you need a refill on your pain medication, please contact your pharmacy.  They will contact our office to request authorization. Prescriptions will not be filled after 5 pm or on weekends.  When to Call us: Fever greater than 100.5 New redness, drainage, or swelling at incision site Severe pain, nausea, or  vomiting Persistent bleeding from incisions Jaundice (yellowing of the whites of the eyes or skin)  Follow-up You have an appointment scheduled with Dr. Freida Busman on February 27, 2024 at 9:20am. This will be at the [facility] office at [address], [place], [place]. Please arrive at least 15 minutes prior to your scheduled appointment time.  The clinic staff is available to answer your questions during regular business hours.  Please don't hesitate to call and ask to speak to one of the nurses for clinical concerns.  If you have a medical emergency, go to the nearest emergency room or call 911.  A surgeon from [facility] is always on call at the hospital  [address], [address], [place], [place]  [address] ?  [address] [address], [place], [place]   [address] [phone] ? Toll Free: [phone] ? FAX [phone] Web site: [email or web address]

## 2024-02-07 NOTE — Telephone Encounter (Signed)
Tried calling to patient to schedule telephone visit unable to reach her due to telephone number we have on file doesn't work called patient's son who answered and was with patient who was getting out of the hospital. Patient informed me she had surgery done already I have made preop APP aware

## 2024-02-07 NOTE — Plan of Care (Signed)

## 2024-02-07 NOTE — Progress Notes (Signed)
DISCHARGE NOTE HOME [employer] to be discharged Home per MD order. Discussed prescriptions and follow up appointments with the patient. Prescriptions given to patient; medication list explained in detail. Patient verbalized understanding.  Skin clean, dry and intact without evidence of skin break down, no evidence of skin tears noted. IV catheter discontinued intact. Site without signs and symptoms of complications. Dressing and pressure applied. Pt denies pain at the site currently. No complaints noted.  Patient free of lines, drains, and wounds other than noted on LDA  An After Visit Summary (AVS) was printed and given to the patient. Patient escorted via wheelchair, and discharged home via private auto.  Velia Meyer, RN

## 2024-02-07 NOTE — Discharge Summary (Addendum)
Physician Discharge Summary   Patient ID: Allison Schneider [id number] 78 y.o. 03/13/1946  Admit date: 02/06/2024  Discharge date and time: 02/07/2024  Admitting Physician: Fritzi Mandes, MD   Discharge Physician: Sophronia Simas, MD  Admission Diagnoses: Hepatic cyst [K76.89]  Discharge Diagnoses: Hepatic cyst  Admission Condition: good  Discharged Condition: good  Indication for Admission: Allison Schneider is a 77 yo female who was referred with a very large cyst in the right lobe of the liver. She has developed some symptoms, and after a discussion of the risks and benefits of surgery, consented to proceed with laparoscopic fenestration.   Hospital Course: The patient was taken to the OR on 02/06/24 for a laparoscopic fenestration of the liver cyst. Please see separately dictated operative note for further details. Postoperatively she was admitted to the med-surg floor in stable condition for overnight observation. She was advanced to a regular diet, which she tolerated. On the morning of POD1, her pain was controlled with oral medications and she was hemodynamically stable. She was examined and deemed appropriate for discharge home.  Consults: None  Significant Diagnostic Studies: Surgical pathology pending  Treatments: analgesia: acetaminophen, Morphine, robaxin and tramadol and surgery: Laparoscopic fenestration of hepatic cyst  Discharge Exam: General: resting comfortably, NAD Neuro: alert and oriented, no focal deficits Resp: normal work of breathing on room air CV: RRR Abdomen: soft, nondistended, nontender to palpation. Incisions clean and dry with no erythema or induration. Extremities: warm and well-perfused   Disposition: Discharge disposition: 01-Home or Self Care       Patient Instructions:  Allergies as of 02/07/2024   No Known Allergies      Medication List     TAKE these medications    acetaminophen 500 MG tablet Commonly known as: TYLENOL Take 2  tablets (1,000 mg total) by mouth every 8 (eight) hours as needed for mild pain (pain score 1-3).   amLODipine 10 MG tablet Commonly known as: NORVASC Take 10 mg by mouth in the morning.   ARNICARE EX Apply 1 Application topically as needed (pain.).   BIOFREEZE EX Apply 1 Application topically as needed (pain).   BIOTIN PO Take 1 tablet by mouth in the morning.   clopidogrel 75 MG tablet Commonly known as: PLAVIX Take 1 tablet (75 mg total) by mouth daily.   COQ10 PO Take 1 tablet by mouth in the morning.   docusate sodium 100 MG capsule Commonly known as: COLACE Take 1 capsule (100 mg total) by mouth 2 (two) times daily as needed for mild constipation.   hydrochlorothiazide 25 MG tablet Commonly known as: HYDRODIURIL Take 25 mg by mouth every morning.   multivitamin with minerals Tabs tablet Take 1 tablet by mouth in the morning. Women 50+   polyethylene glycol 17 g packet Commonly known as: MIRALAX / GLYCOLAX Take 17 g by mouth daily as needed for mild constipation.   rosuvastatin 20 MG tablet Commonly known as: CRESTOR Take 20 mg by mouth every Monday, Wednesday, and Friday.   traMADol 50 MG tablet Commonly known as: ULTRAM Take 1 tablet (50 mg total) by mouth every 6 (six) hours as needed for up to 5 days for severe pain (pain score 7-10) (mild pain).   VITAMIN B-12 PO Take 1 tablet by mouth in the morning.   Vitamin C Chew Chew 2 tablets by mouth 3 (three) times a week. Gummies   VITAMIN D-3 PO Take 1 tablet by mouth in the morning.   VITAMIN E PO Take 1  capsule by mouth in the morning.   Voltaren 1 % Gel Generic drug: diclofenac Sodium Apply 1 Application topically 4 (four) times daily as needed (pain.).   ZINC PO Take 1 tablet by mouth in the morning.       Activity: no driving while on analgesics and no heavy lifting for 4 weeks Diet: regular diet Wound Care: keep wound clean and dry  Follow-up with Dr. Freida Busman on March  5.  Signed: Fritzi Mandes 02/07/2024 8:10 AM

## 2024-02-07 NOTE — Anesthesia Postprocedure Evaluation (Signed)
Anesthesia Post Note  Patient: Allison Schneider  Procedure(s) Performed: LAPAROSCOPIC FENESTRATION OF HEPATIC CYST (Abdomen)     Patient location during evaluation: PACU Anesthesia Type: General Level of consciousness: awake and alert Pain management: pain level controlled Vital Signs Assessment: post-procedure vital signs reviewed and stable Respiratory status: spontaneous breathing, nonlabored ventilation and respiratory function stable Cardiovascular status: blood pressure returned to baseline and stable Postop Assessment: no apparent nausea or vomiting Anesthetic complications: no   No notable events documented.  Last Vitals:  Vitals:   02/07/24 0041 02/07/24 0436  BP: 134/61 (!) 130/92  Pulse: 66 71  Resp: 17 17  Temp: 36.6 C 36.7 C  SpO2: 97% 95%    Last Pain:  Vitals:   02/07/24 0041  TempSrc: Oral  PainSc:                  Lowella Curb

## 2024-02-08 LAB — SURGICAL PATHOLOGY

## 2024-05-19 NOTE — Progress Notes (Deleted)
 Cardiology Office Note:    Date:  05/19/2024   ID:  Allison Schneider, DOB 08/16/1946, MRN [id number]  PCP:  Mordechai April, DO  Cardiologist:  Avery Bodo, MD  Electrophysiologist:  None   Referring MD: Mordechai April, DO   No chief complaint on file. ***  History of Present Illness:    Allison Schneider is a 78 y.o. female with a hx of aortic stenosis, hypertension, hyperlipidemia, CVA who is referred by Dr. Donalynn Fry for evaluation of aortic stenosis.  Echocardiogram 08/2023 showed hyperdynamic LV systolic function, normal RV function, moderate to severe aortic stenosis (mean gradient 27 mmHg, V-max 3.4 m/s, AVA 0.9 cm).  Past Medical History:  Diagnosis Date   Aortic stenosis    Arthritis    Hepatic cyst 01/23/2023   in [place]   HLD (hyperlipidemia)    Hypertension    Obesity    RBBB    Stroke [facility])    ischemic stroke    Past Surgical History:  Procedure Laterality Date   BREAST BIOPSY     LAPAROSCOPIC LIVER CYST REMOVAL N/A 02/06/2024   Procedure: LAPAROSCOPIC FENESTRATION OF HEPATIC CYST;  Surgeon: Lujean Sake, MD;  Location: [facility] OR;  Service: General;  Laterality: N/A;    Current Medications: No outpatient medications have been marked as taking for the 05/20/24 encounter (Appointment) with Wendie Hamburg, MD.     Allergies:   Patient has no known allergies.   Social History   Socioeconomic History   Marital status: Widowed    Spouse name: Not on file   Number of children: 2   Years of education: Not on file   Highest education level: Not on file  Occupational History   Not on file  Tobacco Use   Smoking status: Never   Smokeless tobacco: Never  Vaping Use   Vaping status: Never Used  Substance and Sexual Activity   Alcohol use: No    Alcohol/week: 0.0 standard drinks of alcohol   Drug use: No   Sexual activity: Not on file    Comment: Hysterectomy  Other Topics Concern   Not on file  Social History Narrative   ** Merged History Encounter  **       Live alone. Caffeine avg 1-2 weekly Right handed.   One son living.  (one passed).    Social Drivers of Corporate investment banker Strain: Not on file  Food Insecurity: No Food Insecurity (02/06/2024)   Hunger Vital Sign    Worried About Running Out of Food in the Last Year: Never true    Ran Out of Food in the Last Year: Never true  Transportation Needs: No Transportation Needs (02/06/2024)   PRAPARE - Administrator, Civil Service (Medical): No    Lack of Transportation (Non-Medical): No  Physical Activity: Not on file  Stress: Not on file  Social Connections: Unknown (02/06/2024)   Social Connection and Isolation Panel [NHANES]    Frequency of Communication with Friends and Family: Three times a week    Frequency of Social Gatherings with Friends and Family: Three times a week    Attends Religious Services: More than 4 times per year    Active Member of Clubs or Organizations: No    Attends Engineer, structural: More than 4 times per year    Marital Status: Patient declined     Family History: The patient's ***family history includes Breast cancer in her sister; CAD in her mother.  ROS:  Please see the history of present illness.    *** All other systems reviewed and are negative.  EKGs/Labs/Other Studies Reviewed:    The following studies were reviewed today: ***  EKG:  EKG is *** ordered today.  The ekg ordered today demonstrates ***  Recent Labs: 01/31/2024: BUN 24; Creatinine, Ser 1.06; Hemoglobin 13.2; Platelets 204; Potassium 3.7; Sodium 143  Recent Lipid Panel    Component Value Date/Time   CHOL 255 (H) 06/26/2016 0515   TRIG 49 06/26/2016 0515   HDL 56 06/26/2016 0515   CHOLHDL 4.6 06/26/2016 0515   VLDL 10 06/26/2016 0515   LDLCALC 189 (H) 06/26/2016 0515    Physical Exam:    VS:  There were no vitals taken for this visit.    Wt Readings from Last 3 Encounters:  02/06/24 158 lb 15.2 oz (72.1 kg)  01/31/24 159 lb 8 oz  (72.3 kg)  09/10/23 158 lb (71.7 kg)     GEN: *** Well nourished, well developed in no acute distress HEENT: Normal NECK: No JVD; No carotid bruits LYMPHATICS: No lymphadenopathy CARDIAC: ***RRR, no murmurs, rubs, gallops RESPIRATORY:  Clear to auscultation without rales, wheezing or rhonchi  ABDOMEN: Soft, non-tender, non-distended MUSCULOSKELETAL:  No edema; No deformity  SKIN: Warm and dry NEUROLOGIC:  Alert and oriented x 3 PSYCHIATRIC:  Normal affect   ASSESSMENT:    No diagnosis found. PLAN:    Aortic stenosis: Echocardiogram 08/2023 showed hyperdynamic LV systolic function, normal RV function, moderate to severe aortic stenosis (mean gradient 27 mmHg, V-max 3.4 m/s, AVA 0.9 cm). - Repeat echocardiogram to monitor  Hypertension: Continue amlodipine  10 mg daily, HCTZ 25 mg daily  Hyperlipidemia: On rosuvastatin  20 mg daily  CVA: Admitted with acute right lateral thalamic lacunar infarct thought to be secondary to small vessel disease in 2017.  Continue Plavix , statin  RTC in***   Medication Adjustments/Labs and Tests Ordered: Current medicines are reviewed at length with the patient today.  Concerns regarding medicines are outlined above.  No orders of the defined types were placed in this encounter.  No orders of the defined types were placed in this encounter.   There are no Patient Instructions on file for this visit.   Signed, Wendie Hamburg, MD  05/19/2024 9:30 PM    [facility]

## 2024-05-20 ENCOUNTER — Ambulatory Visit: Attending: Cardiology | Admitting: Cardiology

## 2024-05-21 ENCOUNTER — Encounter: Payer: Self-pay | Admitting: Cardiology

## 2024-09-12 ENCOUNTER — Telehealth: Payer: Self-pay

## 2024-09-12 NOTE — Telephone Encounter (Signed)
 When called to schedule from recall patient states she is seeing another cardiologist.

## 2024-09-12 NOTE — Telephone Encounter (Signed)
 error

## 2024-09-18 ENCOUNTER — Ambulatory Visit: Payer: Self-pay | Admitting: Internal Medicine

## 2024-09-18 ENCOUNTER — Ambulatory Visit (HOSPITAL_COMMUNITY)
Admission: RE | Admit: 2024-09-18 | Discharge: 2024-09-18 | Disposition: A | Discharge status: Home / Self Care | Source: Ambulatory Visit | Attending: Internal Medicine | Admitting: Internal Medicine

## 2024-09-18 DIAGNOSIS — I6523 Occlusion and stenosis of bilateral carotid arteries: Secondary | ICD-10-CM | POA: Diagnosis present

## 2024-11-13 NOTE — Progress Notes (Signed)
 Patient ID: Allison Schneider MRN: [id number] DOB/AGE: [birth year] 78 y.o.  Primary Care Physician:Welborn, Bernardino, DO Primary Cardiologist: Wendel  CC:  Aortic valvular disease management     FOCUSED PROBLEM LIST:   Aortic stenosis AVA 1.2, MG 34, V-max 3.8, EF 65% TTE [facility] May 2025 EKG normal sinus rhythm, right bundle branch block Hypertension Hyperlipidemia CVA 2017 Carotid artery disease 60 to 79% possibly near 80 to 99% left ICA; 40 to 59% right ICA Hepatic cyst S/p drainage 2025 BMI 17 November 2024:  Patient consents to use of AI scribe. The patient is an 78 year old female with above listed medical problems referred for recommendations regarding her aortic valvular disease.  The patient was seen by her primary cardiologist recently.  She reported increasing exertional dyspnea as well as some lightheadedness on occasion.  She also had exertional chest discomfort as well.  Over the past year, she has experienced increased fatigue and shortness of breath, particularly during physical activities such as lifting heavy objects or walking long distances. These symptoms have progressed to the point where she now requires a wheelchair when traveling by air.  She also experiences occasional dizziness and weakness, and she tires more easily than she did a few years ago. Additionally, she has swelling in her ankles, especially when on her feet for extended periods. No orthopnea, bleeding issues, or dental problems.  Her past medical history includes a mild stroke in 2017 with no residual deficits. In February, she had a hepatic cyst drained, which contained approximately four liters of fluid, and she reports healing well from this procedure with no further issues.  She is currently on medications for high blood pressure and cholesterol. She lives alone, is widowed, and travels between [place]  and [place]. She manages her daily activities independently, including  driving and grocery shopping. She is not diabetic and does not smoke.  She denies any dental complaints.        Past Medical History:  Diagnosis Date   Aortic stenosis    Arthritis    Hepatic cyst 01/23/2023   in [place]   HLD (hyperlipidemia)    Hypertension    Obesity    RBBB    Stroke [facility])    ischemic stroke    Past Surgical History:  Procedure Laterality Date   BREAST BIOPSY     LAPAROSCOPIC LIVER CYST REMOVAL N/A 02/06/2024   Procedure: LAPAROSCOPIC FENESTRATION OF HEPATIC CYST;  Surgeon: Dasie Leonor CROME, MD;  Location: [facility] OR;  Service: General;  Laterality: N/A;    Family History  Problem Relation Age of Onset   CAD Mother        MI   Breast cancer Sister     Social History   Socioeconomic History   Marital status: Widowed    Spouse name: Not on file   Number of children: 2   Years of education: Not on file   Highest education level: Not on file  Occupational History   Not on file  Tobacco Use   Smoking status: Never   Smokeless tobacco: Never  Vaping Use   Vaping status: Never Used  Substance and Sexual Activity   Alcohol use: No    Alcohol/week: 0.0 standard drinks of alcohol   Drug use: No   Sexual activity: Not on file    Comment: Hysterectomy  Other Topics Concern   Not on file  Social History Narrative   ** Merged History Encounter **       Live  alone. Caffeine avg 1-2 weekly Right handed.   One son living.  (one passed).    Social Drivers of Corporate Investment Banker Strain: Not on file  Food Insecurity: No Food Insecurity (02/06/2024)   Hunger Vital Sign    Worried About Running Out of Food in the Last Year: Never true    Ran Out of Food in the Last Year: Never true  Transportation Needs: No Transportation Needs (07/08/2024)   Received from [facility] - Transportation    In the past 12 months, has lack of transportation kept you from medical appointments or from getting medications?: No    In the past 12 months, has lack  of transportation kept you from meetings, work, or from getting things needed for daily living?: No  Physical Activity: Unknown (07/08/2024)   Received from [facility]   Exercise Vital Sign    On average, how many days per week do you engage in moderate to strenuous exercise (like a brisk walk)?: Patient declined    Minutes of Exercise per Session: Not on file  Stress: Not on file  Social Connections: Unknown (02/06/2024)   Social Connection and Isolation Panel    Frequency of Communication with Friends and Family: Three times a week    Frequency of Social Gatherings with Friends and Family: Three times a week    Attends Religious Services: More than 4 times per year    Active Member of Clubs or Organizations: No    Attends Banker Meetings: More than 4 times per year    Marital Status: Patient declined  Intimate Partner Violence: Patient Declined (07/08/2024)   Received from [facility]   HITS    Over the last 12 months how often did your partner physically hurt you?: Patient declined    Over the last 12 months how often did your partner insult you or talk down to you?: Patient declined    Over the last 12 months how often did your partner threaten you with physical harm?: Patient declined    Over the last 12 months how often did your partner scream or curse at you?: Patient declined     Prior to Admission medications   Medication Sig Start Date End Date Taking? Authorizing Provider  acetaminophen  (TYLENOL ) 500 MG tablet Take 2 tablets (1,000 mg total) by mouth every 8 (eight) hours as needed for mild pain (pain score 1-3). 02/07/24   Dasie Leonor CROME, MD  amLODipine  (NORVASC ) 10 MG tablet Take 10 mg by mouth in the morning.    [provider]  Bioflavonoid Products (VITAMIN C) CHEW Chew 2 tablets by mouth 3 (three) times a week. Gummies    [provider]  BIOTIN PO Take 1 tablet by mouth in the morning.    [provider]  Cholecalciferol  (VITAMIN D-3 PO) Take 1 tablet by mouth in the morning.    [provider]  clopidogrel  (PLAVIX ) 75 MG tablet Take 1 tablet (75 mg total) by mouth daily. 06/28/16   Evonnie Lenis, MD  Coenzyme Q10 (COQ10 PO) Take 1 tablet by mouth in the morning.    [provider]  Cyanocobalamin (VITAMIN B-12 PO) Take 1 tablet by mouth in the morning.    [provider]  diclofenac Sodium (VOLTAREN) 1 % GEL Apply 1 Application topically 4 (four) times daily as needed (pain.).    [provider]  docusate sodium  (COLACE) 100 MG capsule Take 1 capsule (100 mg  total) by mouth 2 (two) times daily as needed for mild constipation. 02/07/24   Dasie Leonor CROME, MD  Homeopathic Products (ARNICARE EX) Apply 1 Application topically as needed (pain.).    [provider]  hydrochlorothiazide  (HYDRODIURIL ) 25 MG tablet Take 25 mg by mouth every morning. 09/17/23   [provider]  Menthol, Topical Analgesic, (BIOFREEZE EX) Apply 1 Application topically as needed (pain).    [provider]  Multiple Vitamin (MULTIVITAMIN WITH MINERALS) TABS tablet Take 1 tablet by mouth in the morning. Women 50+    [provider]  Multiple Vitamins-Minerals (ZINC PO) Take 1 tablet by mouth in the morning.    [provider]  polyethylene glycol (MIRALAX  / GLYCOLAX ) 17 g packet Take 17 g by mouth daily as needed for mild constipation. 02/07/24   Dasie Leonor CROME, MD  rosuvastatin  (CRESTOR ) 20 MG tablet Take 20 mg by mouth every Monday, Wednesday, and Friday.    [provider]  VITAMIN E PO Take 1 capsule by mouth in the morning.    [provider]    No Known Allergies  REVIEW OF SYSTEMS:  General: no fevers/chills/night sweats Eyes: no blurry vision, diplopia, or amaurosis ENT: no sore throat or hearing loss Resp: no cough, wheezing, or hemoptysis CV: no edema or palpitations GI: no abdominal pain, nausea, vomiting, diarrhea, or constipation GU: no  dysuria, frequency, or hematuria Skin: no rash Neuro: no headache, numbness, tingling, or weakness of extremities Musculoskeletal: no joint pain or swelling Heme: no bleeding, DVT, or easy bruising Endo: no polydipsia or polyuria  BP 136/84   Pulse 84   Ht 5' 7 (1.702 m)   Wt 156 lb (70.8 kg)   SpO2 95%   BMI 24.43 kg/m   PHYSICAL EXAM: GEN:  AO x 3 in no acute distress HEENT: normal Dentition: Normal Neck: JVP normal.  Left carotid bruit. No thyromegaly. Lungs: equal expansion, clear bilaterally CV: Apex is discrete and nondisplaced, RRR 3 out of 6 systolic murmur Abd: soft, non-tender, non-distended; no bruit; positive bowel sounds Ext: no edema, ecchymoses, or cyanosis Vascular: 2+ femoral pulses, 2+ radial pulses       Skin: warm and dry without rash Neuro: CN II-XII grossly intact; motor and sensory grossly intact    DATA AND STUDIES:  EKG:  EKG Interpretation Date/Time:  Friday November 14 2024 11:01:15 EST Ventricular Rate:  89 PR Interval:  150 QRS Duration:  142 QT Interval:  390 QTC Calculation: 474 R Axis:   -66  Text Interpretation: Normal sinus rhythm Left axis deviation Right bundle branch block Septal infarct , age undetermined When compared with ECG of 10-Sep-2023 10:39, Septal infarct is now Present Confirmed by Wendel Haws ([id number]) on 11/14/2024 11:15:00 AM        CARDIAC STUDIES: Refer to CV Procedures and Imaging Tabs  01/31/2024: BUN 24; Creatinine, Ser 1.06; Hemoglobin 13.2; Platelets 204; Potassium 3.7; Sodium 143   STS RISK CALCULATOR: Pending  NYHA CLASS: 1/2    ASSESSMENT AND PLAN:   1. Nonrheumatic aortic valve stenosis   2. Primary hypertension   3. Hyperlipidemia LDL goal <70   4. Cerebrovascular accident (CVA) due to thrombosis of right posterior cerebral artery (HCC)   5. Bilateral carotid artery stenosis   6. RBBB   7. Carotid artery disease, unspecified laterality, unspecified type     Aortic stenosis: Patient is  developed mild symptoms that I think are related to her aortic valvular disease.  We had a long  discussion about a potential evaluation.  Patient believes that her symptoms are relatively mild and would like to defer this for now.  I will see her back in 6 months with another echocardiogram or earlier if needed.  I have asked her to contact our office if she has any increasing dyspnea, exertional chest discomfort, presyncope, or syncope.  She understands and agrees with this plan. Hypertension: Continue amlodipine  10 mg, hydrochlorothiazide  25 mg Hyperlipidemia: Continue rosuvastatin  20 mg; followed by patient's primary cardiologist. History of stroke: In 2017.  Continue Plavix  75 mg, rosuvastatin  20 mg. Carotid artery disease: Continue Plavix  75 mg, rosuvastatin  20 mg.  Refer to vascular surgery for further recommendations. Right bundle branch block: If TAVR is pursued this does increase her risk for a permanent pacemaker.   I have personally reviewed the patients imaging data as summarized above.  I have reviewed the natural history of aortic stenosis with the patient and family members who are present today. We have discussed the limitations of medical therapy and the poor prognosis associated with symptomatic aortic stenosis. We have also reviewed potential treatment options, including palliative medical therapy, conventional surgical aortic valve replacement, and transcatheter aortic valve replacement. We discussed treatment options in the context of this patient's specific comorbid medical conditions.   All of the patient's questions were answered today. Will make further recommendations based on the results of studies outlined above.   I spent 42 minutes reviewing all clinical data during and prior to this visit including all relevant imaging studies, laboratories, clinical information from other health systems and prior notes from both Cardiology and other specialties, interviewing the patient,  conducting a complete physical examination, and coordinating care in order to formulate a comprehensive and personalized evaluation and treatment plan.   Kapri Nero K Baraa Tubbs, MD  11/14/2024 12:02 PM    [facility] [address] [place], [place], [place]  [address] Phone: [phone]; Fax: [phone]

## 2024-11-14 ENCOUNTER — Ambulatory Visit: Attending: Internal Medicine | Admitting: Internal Medicine

## 2024-11-14 ENCOUNTER — Encounter: Payer: Self-pay | Admitting: Internal Medicine

## 2024-11-14 VITALS — BP 136/84 | HR 84 | Ht 67.0 in | Wt 156.0 lb

## 2024-11-14 DIAGNOSIS — I63331 Cerebral infarction due to thrombosis of right posterior cerebral artery: Secondary | ICD-10-CM

## 2024-11-14 DIAGNOSIS — I10 Essential (primary) hypertension: Secondary | ICD-10-CM | POA: Diagnosis not present

## 2024-11-14 DIAGNOSIS — E785 Hyperlipidemia, unspecified: Secondary | ICD-10-CM

## 2024-11-14 DIAGNOSIS — I4510 Unspecified right bundle-branch block: Secondary | ICD-10-CM

## 2024-11-14 DIAGNOSIS — I779 Disorder of arteries and arterioles, unspecified: Secondary | ICD-10-CM

## 2024-11-14 DIAGNOSIS — I6523 Occlusion and stenosis of bilateral carotid arteries: Secondary | ICD-10-CM

## 2024-11-14 DIAGNOSIS — I350 Nonrheumatic aortic (valve) stenosis: Secondary | ICD-10-CM

## 2024-11-14 NOTE — Patient Instructions (Signed)
 Medication Instructions:  Your physician recommends that you continue on your current medications as directed. Please refer to the Current Medication list given to you today.  *If you need a refill on your cardiac medications before your next appointment, please call your pharmacy*  Lab Work: None ordered If you have labs (blood work) drawn today and your tests are completely normal, you will receive your results only by: MyChart Message (if you have MyChart) OR A paper copy in the mail If you have any lab test that is abnormal or we need to change your treatment, we will call you to review the results.  Testing/Procedures: ECHOCARDIOGRAM  Follow-Up: At [facility], you and your health needs are our priority.  As part of our continuing mission to provide you with exceptional heart care, our providers are all part of one team.  This team includes your primary Cardiologist (physician) and Advanced Practice Providers or APPs (Physician Assistants and Nurse Practitioners) who all work together to provide you with the care you need, when you need it.  Your next appointment:   6 month(s)  Provider:   Arun K Thukkani, MD    We recommend signing up for the patient portal called MyChart.  Sign up information is provided on this After Visit Summary.  MyChart is used to connect with patients for Virtual Visits (Telemedicine).  Patients are able to view lab/test results, encounter notes, upcoming appointments, etc.  Non-urgent messages can be sent to your provider as well.   To learn more about what you can do with MyChart, go to [email or web address].   Other Instructions We are referring you to Vascular Surgery for Carotid Disease.

## 2024-11-18 ENCOUNTER — Ambulatory Visit: Payer: Self-pay | Admitting: Internal Medicine

## 2024-11-18 ENCOUNTER — Ambulatory Visit (HOSPITAL_COMMUNITY)
Admission: RE | Admit: 2024-11-18 | Discharge: 2024-11-18 | Disposition: A | Discharge status: Home / Self Care | Source: Ambulatory Visit | Attending: Internal Medicine | Admitting: Internal Medicine

## 2024-11-18 DIAGNOSIS — I4510 Unspecified right bundle-branch block: Secondary | ICD-10-CM | POA: Diagnosis present

## 2024-11-18 LAB — ECHOCARDIOGRAM COMPLETE
AR max vel: 0.58 cm2
AV Area VTI: 0.54 cm2
AV Area mean vel: 0.58 cm2
AV Mean grad: 41.3 mmHg
AV Peak grad: 69.6 mmHg
Ao pk vel: 4.17 m/s
Area-P 1/2: 3.17 cm2
S' Lateral: 2.1 cm

## 2024-12-03 ENCOUNTER — Encounter: Payer: Self-pay | Admitting: Vascular Surgery

## 2024-12-03 ENCOUNTER — Ambulatory Visit: Attending: Vascular Surgery | Admitting: Vascular Surgery

## 2024-12-03 VITALS — BP 114/73 | HR 75 | Temp 98.2°F | Ht 67.0 in | Wt 162.0 lb

## 2024-12-03 DIAGNOSIS — I6523 Occlusion and stenosis of bilateral carotid arteries: Secondary | ICD-10-CM

## 2024-12-03 NOTE — Progress Notes (Signed)
 Patient ID: Allison Schneider, female   DOB: 1946-06-15, 78 y.o.   MRN: [id number]  Reason for Consult: New Patient (Initial Visit)   Referred by Dayna Motto, DO  Subjective:     HPI:  Allison Schneider is a 78 y.o. female originally from [place]  resides most of the time in [place]. [place], [place]  She has a history of aortic stenosis followed by Dr. Wendel.  She also has hypertension hyperlipidemia and a remote history of stroke 8 years ago.  She does have known carotid artery disease for which she presents for evaluation today.  She is on Plavix  and statin and previously took aspirin  but does not now.  She has not had any recent stroke, TIA or amaurosis.  She walks with minimal limitation.  Past Medical History:  Diagnosis Date   Aortic stenosis    Arthritis    Hepatic cyst 01/23/2023   in [place]   HLD (hyperlipidemia)    Hypertension    Obesity    RBBB    Stroke (HCC)    ischemic stroke   Family History  Problem Relation Age of Onset   CAD Mother        MI   Breast cancer Sister    Past Surgical History:  Procedure Laterality Date   BREAST BIOPSY     LAPAROSCOPIC LIVER CYST REMOVAL N/A 02/06/2024   Procedure: LAPAROSCOPIC FENESTRATION OF HEPATIC CYST;  Surgeon: Dasie Leonor CROME, MD;  Location: [facility] OR;  Service: General;  Laterality: N/A;    Short Social History:  Social History   Tobacco Use   Smoking status: Never   Smokeless tobacco: Never  Substance Use Topics   Alcohol use: No    Alcohol/week: 0.0 standard drinks of alcohol    No Known Allergies  Current Outpatient Medications  Medication Sig Dispense Refill   acetaminophen  (TYLENOL ) 500 MG tablet Take 2 tablets (1,000 mg total) by mouth every 8 (eight) hours as needed for mild pain (pain score 1-3).     amLODipine  (NORVASC ) 10 MG tablet Take 10 mg by mouth in the morning.     Bioflavonoid Products (VITAMIN C) CHEW Chew 2 tablets by mouth 3 (three) times a week. Gummies     BIOTIN PO Take 1 tablet by mouth in  the morning.     Cholecalciferol (VITAMIN D-3 PO) Take 1 tablet by mouth in the morning.     clopidogrel  (PLAVIX ) 75 MG tablet Take 1 tablet (75 mg total) by mouth daily. 30 tablet 1   Coenzyme Q10 (COQ10 PO) Take 1 tablet by mouth in the morning.     Cyanocobalamin (VITAMIN B-12 PO) Take 1 tablet by mouth in the morning.     diclofenac Sodium (VOLTAREN) 1 % GEL Apply 1 Application topically 4 (four) times daily as needed (pain.).     docusate sodium  (COLACE) 100 MG capsule Take 1 capsule (100 mg total) by mouth 2 (two) times daily as needed for mild constipation. 10 capsule 0   Homeopathic Products (ARNICARE EX) Apply 1 Application topically as needed (pain.).     hydrochlorothiazide  (HYDRODIURIL ) 25 MG tablet Take 25 mg by mouth every morning.     Menthol, Topical Analgesic, (BIOFREEZE EX) Apply 1 Application topically as needed (pain).     Multiple Vitamin (MULTIVITAMIN WITH MINERALS) TABS tablet Take 1 tablet by mouth in the morning. Women 50+     Multiple Vitamins-Minerals (ZINC PO) Take 1 tablet by mouth in the morning.     polyethylene  glycol (MIRALAX  / GLYCOLAX ) 17 g packet Take 17 g by mouth daily as needed for mild constipation.     rosuvastatin  (CRESTOR ) 20 MG tablet Take 20 mg by mouth every Monday, Wednesday, and Friday.     VITAMIN E PO Take 1 capsule by mouth in the morning.     No current facility-administered medications for this visit.    Review of Systems  Constitutional:  Constitutional negative. HENT: HENT negative.  Eyes: Eyes negative.  Respiratory: Respiratory negative.  Cardiovascular: Cardiovascular negative.  GI: Gastrointestinal negative.  Musculoskeletal: Musculoskeletal negative.  Neurological: Neurological negative. Hematologic: Hematologic/lymphatic negative.        Objective:  Objective   Vitals:   12/03/24 0924  BP: 124/78  Pulse: 75  Temp: 98.2 F (36.8 C)  SpO2: 94%  Weight: 162 lb (73.5 kg)  Height: 5' 7 (1.702 m)   Body mass index is  25.37 kg/m.  Physical Exam HENT:     Head: Normocephalic.     Nose: Nose normal.  Eyes:     Pupils: Pupils are equal, round, and reactive to light.  Neck:     Vascular: Carotid bruit present.     Comments: Bruits are bilateral, likely transmitted from aortic murmur Cardiovascular:     Pulses: Normal pulses.     Heart sounds: Murmur heard.  Pulmonary:     Effort: Pulmonary effort is normal.  Abdominal:     General: Abdomen is flat.  Musculoskeletal:        General: Normal range of motion.     Right lower leg: No edema.     Left lower leg: No edema.  Skin:    General: Skin is warm.     Capillary Refill: Capillary refill takes less than 2 seconds.  Neurological:     General: No focal deficit present.     Mental Status: She is alert.     Data: Right Carotid Findings:  +----------+--------+--------+--------+--------------------------+--------+            PSV cm/sEDV cm/sStenosisPlaque Description         Comments  +----------+--------+--------+--------+--------------------------+--------+   CCA Prox  75      15      <50%    irregular and heterogenous           +----------+--------+--------+--------+--------------------------+--------+   CCA Distal78      20                                                   +----------+--------+--------+--------+--------------------------+--------+   ICA Prox  183     51      40-59%  calcific                             +----------+--------+--------+--------+--------------------------+--------+   ICA Mid   135     45                                                   +----------+--------+--------+--------+--------------------------+--------+   ICA Distal144     42                                                   +----------+--------+--------+--------+--------------------------+--------+  ECA      168     17                                                    +----------+--------+--------+--------+--------------------------+--------+    +----------+--------+-------+----------------+-------------------+           PSV cm/sEDV cmsDescribe        Arm Pressure (mmHG)  +----------+--------+-------+----------------+-------------------+  [id number]           Multiphasic, WNL                     +----------+--------+-------+----------------+-------------------+   +---------+--------+--+--------+-+---------+  VertebralPSV cm/s25EDV cm/s5Antegrade  +---------+--------+--+--------+-+---------+      Left Carotid Findings:  +----------+--------+--------+--------+--------------------+---------+           PSV cm/sEDV cm/sStenosisPlaque Description  Comments   +----------+--------+--------+--------+--------------------+---------+  CCA Prox  77      24                                             +----------+--------+--------+--------+--------------------+---------+  CCA Distal56      19      <50%    smooth                         +----------+--------+--------+--------+--------------------+---------+  ICA Prox  340     112     60-79%  diffuse and calcificShadowing  +----------+--------+--------+--------+--------------------+---------+  ICA Mid   161     63                                             +----------+--------+--------+--------+--------------------+---------+  ICA Distal103     42                                             +----------+--------+--------+--------+--------------------+---------+  ECA      473     67      >50%                                   +----------+--------+--------+--------+--------------------+---------+   +----------+--------+--------+----------------+-------------------+           PSV cm/sEDV cm/sDescribe        Arm Pressure (mmHG)  +----------+--------+--------+----------------+-------------------+  [id number]    0       Multiphasic,  WNL                     +----------+--------+--------+----------------+-------------------+   +---------+--------+--+--------+--+---------+  VertebralPSV cm/s71EDV cm/s30Antegrade  +---------+--------+--+--------+--+---------+   LICA velocities are elevated and nearing 80-99%      Summary:  Right Carotid: Velocities in the right ICA are consistent with a 40-59%                 stenosis. Non-hemodynamically significant plaque <50% noted  in                the CCA.   Left Carotid: Velocities in the left  ICA are consistent with a 60-79%  stenosis,               high-end of range, nearing 80-99%. Shadowing from heavily                calcified plaque, suggest alternative imaging for further                evaluation if clinically indicated. Non-hemodynamically                significant plaque <50% noted in the CCA. The ECA appears  >50%               stenosed.   Vertebrals:  Bilateral vertebral arteries demonstrate antegrade flow.  Subclavians: Normal flow hemodynamics were seen in bilateral subclavian               arteries.      Assessment/Plan:     78 year old female with a history of stroke with moderate grade stenosis of the right ICA which remains asymptomatic.  Given the moderate nearly high-grade nature of the stenosis I recommended dual antiplatelet therapy with the addition of 81 mg of aspirin .  We discussed the signs and symptoms of stroke for which to seek emergent medical attention and she demonstrates good understanding.  I will see her back in around 9 months which will be 1 year from her last carotid duplex although she does live in [place]. [place], [place] and will need to schedule around the timing of her return to [place] .  All questions were answered she demonstrates understanding.     Penne Lonni Colorado MD [facility]
